# Patient Record
Sex: Male | Born: 1965 | Race: Black or African American | Hispanic: No | Marital: Married | Smoking: Never smoker
Health system: Southern US, Community
[De-identification: ages and names within clinical notes are randomized; demographics above are authoritative.]

## PROBLEM LIST (undated history)

## (undated) DIAGNOSIS — I10 Essential (primary) hypertension: Secondary | ICD-10-CM

## (undated) DIAGNOSIS — G8929 Other chronic pain: Secondary | ICD-10-CM

## (undated) DIAGNOSIS — T148XXA Other injury of unspecified body region, initial encounter: Secondary | ICD-10-CM

## (undated) DIAGNOSIS — M1990 Unspecified osteoarthritis, unspecified site: Secondary | ICD-10-CM

## (undated) DIAGNOSIS — E785 Hyperlipidemia, unspecified: Secondary | ICD-10-CM

## (undated) DIAGNOSIS — G479 Sleep disorder, unspecified: Secondary | ICD-10-CM

## (undated) DIAGNOSIS — R011 Cardiac murmur, unspecified: Secondary | ICD-10-CM

## (undated) DIAGNOSIS — R51 Headache: Secondary | ICD-10-CM

## (undated) DIAGNOSIS — R350 Frequency of micturition: Secondary | ICD-10-CM

## (undated) HISTORY — PX: QUADRICEPS REPAIR: SHX2281

## (undated) HISTORY — DX: Other chronic pain: G89.29

## (undated) HISTORY — DX: Hyperlipidemia, unspecified: E78.5

## (undated) HISTORY — PX: KNEE ARTHROSCOPY: SUR90

## (undated) HISTORY — PX: HERNIA REPAIR: SHX51

---

## 2003-02-04 ENCOUNTER — Encounter: Payer: Self-pay | Admitting: Emergency Medicine

## 2003-02-04 ENCOUNTER — Emergency Department (HOSPITAL_COMMUNITY): Admission: EM | Admit: 2003-02-04 | Discharge: 2003-02-04 | Payer: Self-pay | Admitting: Emergency Medicine

## 2003-02-07 ENCOUNTER — Emergency Department (HOSPITAL_COMMUNITY): Admission: EM | Admit: 2003-02-07 | Discharge: 2003-02-07 | Payer: Self-pay | Admitting: Emergency Medicine

## 2003-10-30 ENCOUNTER — Emergency Department (HOSPITAL_COMMUNITY): Admission: EM | Admit: 2003-10-30 | Discharge: 2003-10-30 | Payer: Self-pay | Admitting: Emergency Medicine

## 2003-11-04 ENCOUNTER — Ambulatory Visit (HOSPITAL_COMMUNITY): Admission: RE | Admit: 2003-11-04 | Discharge: 2003-11-04 | Payer: Self-pay | Admitting: Orthopaedic Surgery

## 2006-07-06 ENCOUNTER — Emergency Department (HOSPITAL_COMMUNITY): Admission: EM | Admit: 2006-07-06 | Discharge: 2006-07-06 | Payer: Self-pay | Admitting: Family Medicine

## 2006-07-07 ENCOUNTER — Emergency Department (HOSPITAL_COMMUNITY): Admission: EM | Admit: 2006-07-07 | Discharge: 2006-07-07 | Payer: Self-pay | Admitting: Emergency Medicine

## 2006-08-09 ENCOUNTER — Encounter: Admission: RE | Admit: 2006-08-09 | Discharge: 2006-08-09 | Payer: Self-pay | Admitting: Family Medicine

## 2007-06-30 ENCOUNTER — Emergency Department (HOSPITAL_COMMUNITY): Admission: EM | Admit: 2007-06-30 | Discharge: 2007-07-01 | Payer: Self-pay | Admitting: Emergency Medicine

## 2009-05-13 IMAGING — CT CT PELVIS W/ CM
1 of 3 series · 14 of 32 positions shown, 19 images · IV contrast (omnipaque)
Comparison: None.

CLINICAL DATA: Right lower quadrant pain. Evaluate for appendicitis. 
 ABDOMEN CT WITH CONTRAST:
TECHNIQUE: Multidetector CT imaging of the abdomen was performed following the standard protocol during bolus administration of intravenous contrast.
 Contrast:  100 cc Omnipaque 300
TECHNIQUE: Multidetector CT imaging of the pelvis was performed following the standard protocol during bolus administration of intravenous contrast.

[Series 2: abd_pel 5.0 b40f st · axial · 0.73mm/px · z∈[-474,-24]mm · 14 of 101 slices shown, 19 images]
[im 6/101  soft-tissue]
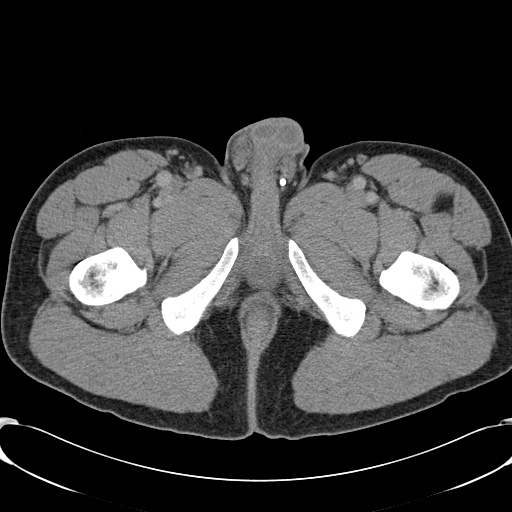
[im 6/101  bone]
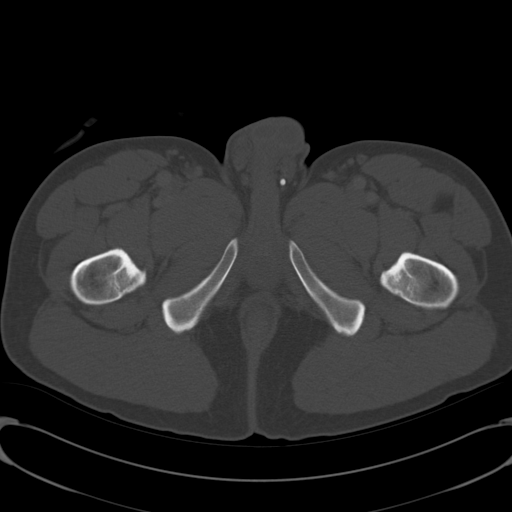
[im 16/101  soft-tissue]
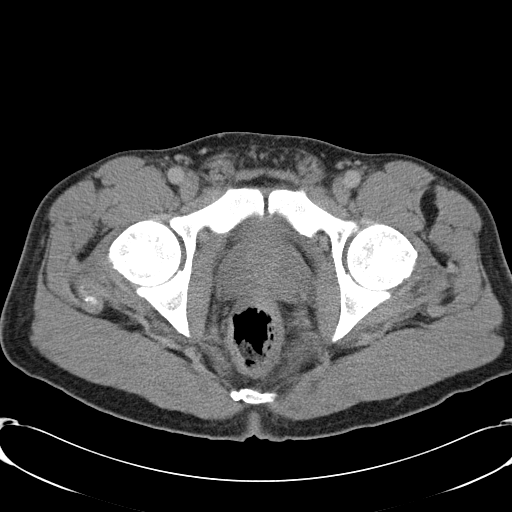
[im 21/101  soft-tissue]
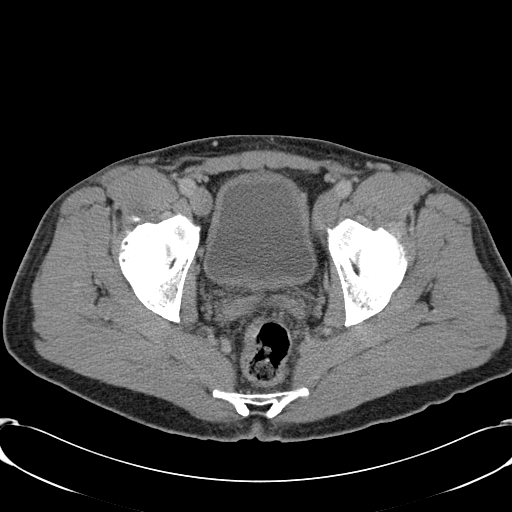
[im 31/101  soft-tissue]
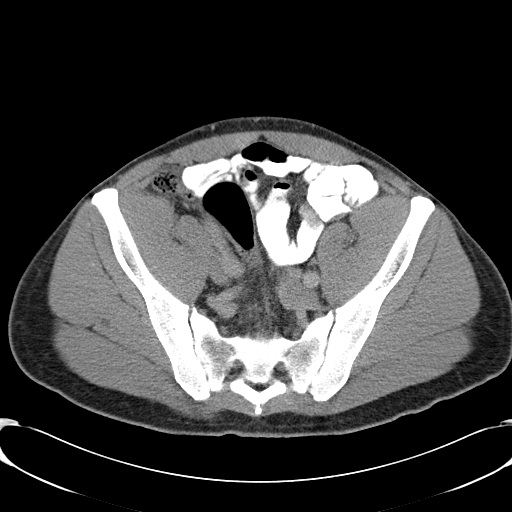
[im 36/101  soft-tissue]
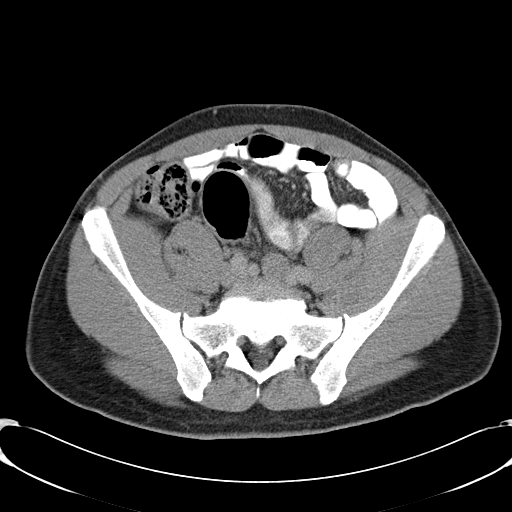
[im 46/101  soft-tissue]
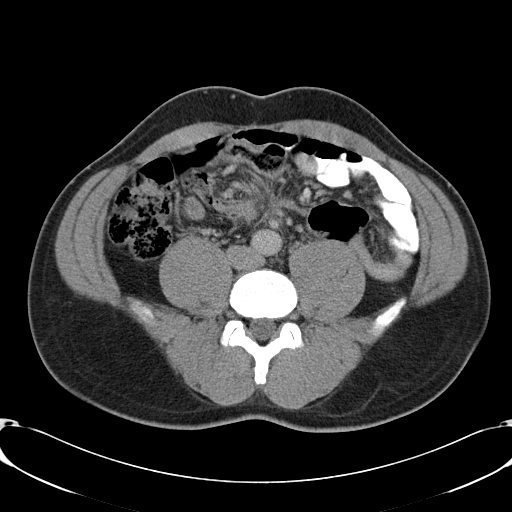
[im 51/101  soft-tissue]
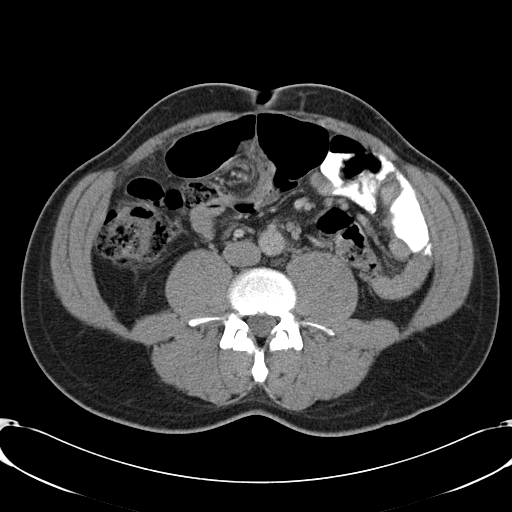
[im 56/101  soft-tissue]
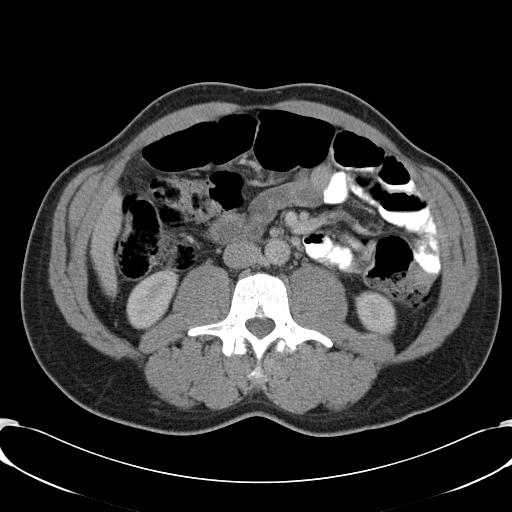
[im 66/101  soft-tissue]
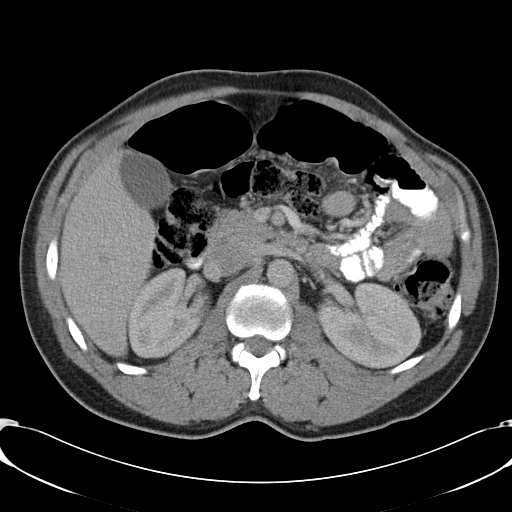
[im 66/101  bone]
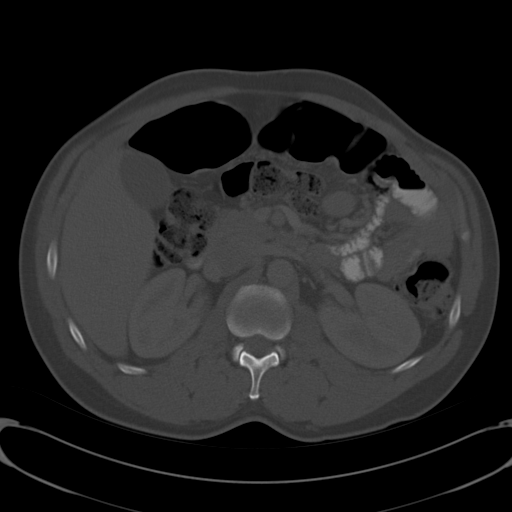
[im 71/101  soft-tissue]
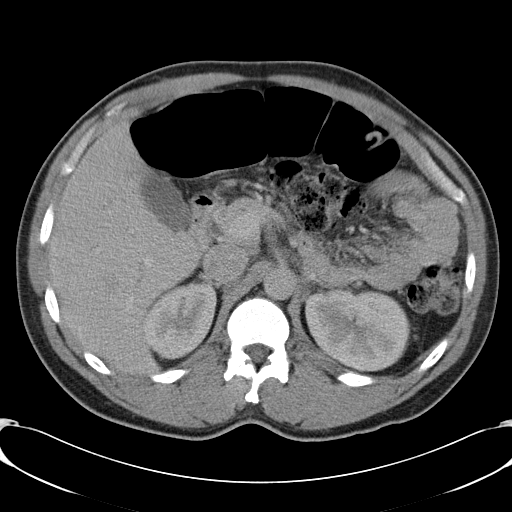
[im 81/101  soft-tissue]
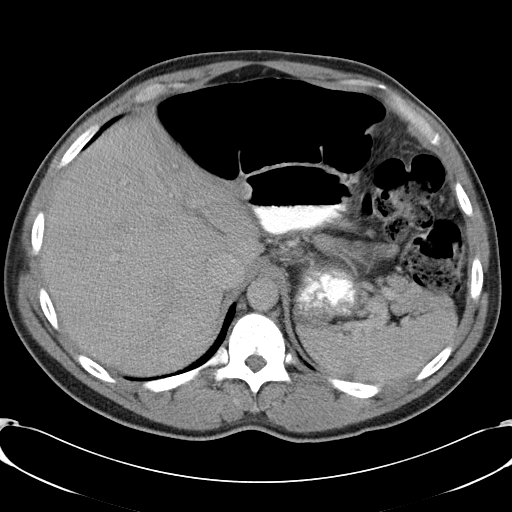
[im 81/101  lung]
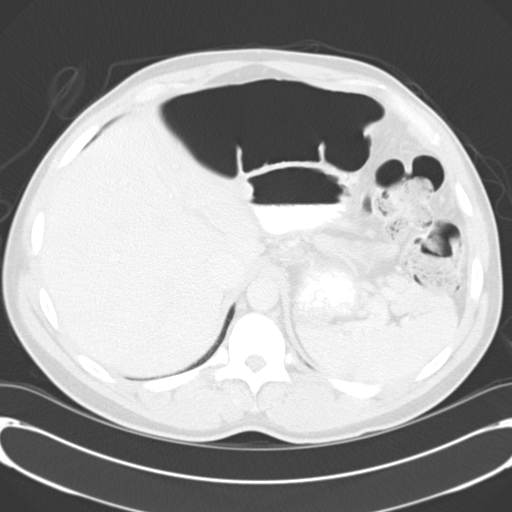
[im 86/101  soft-tissue]
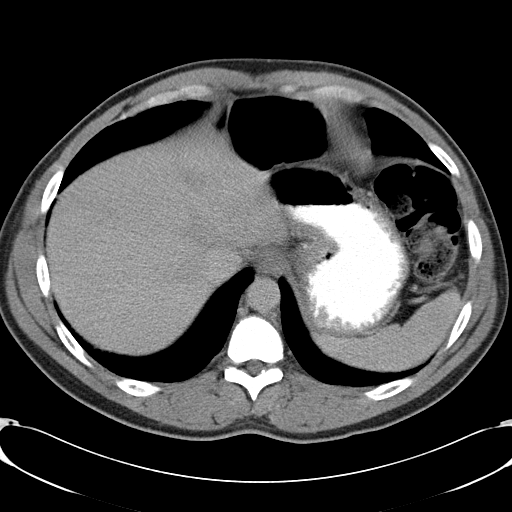
[im 86/101  lung]
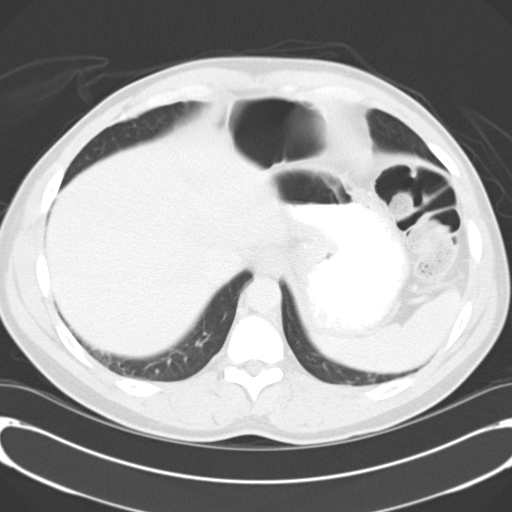
[im 91/101  lung]
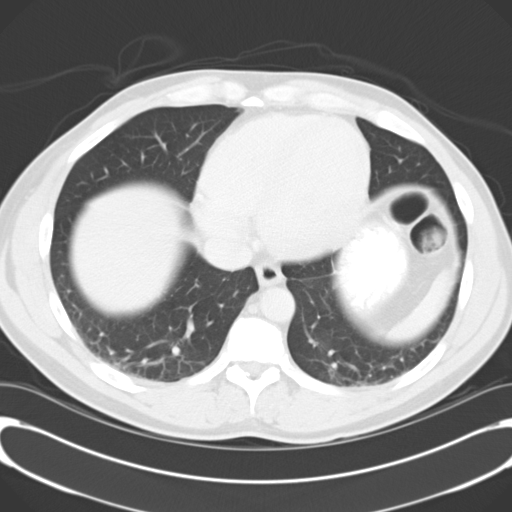
[im 96/101  soft-tissue]
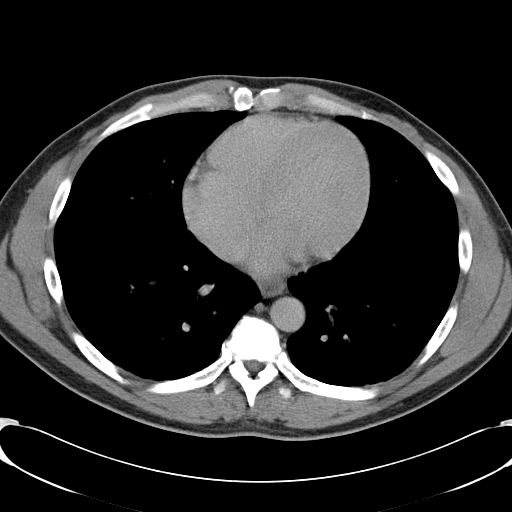
[im 96/101  lung]
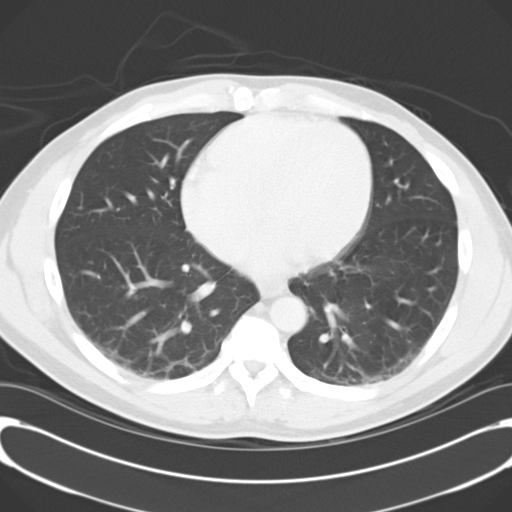

[14 of 32 positions shown; findings below may reference images not displayed]

FINDINGS: No focal abnormality is seen in the liver or spleen. The stomach, duodenum, pancreas, gallbladder, adrenal glands, and right kidney are unremarkable. 2.3 cm well-defined low density lesion in the left kidney is most compatible with a renal cyst. 
 No intraperitoneal free fluid.  No abdominal lymphadenopathy. 
 The sigmoid colon extends into the high abdomen and is seen just below the anterior diaphragm.
IMPRESSION: Distended sigmoid colon extends into the high anterior abdomen.
 PELVIS CT WITH CONTRAST:
FINDINGS: The sigmoid colon appears to twist upon itself and is distended up to 6 cm in diameter, again extending high into the anterior abdomen. There is no substantial edema within the sigmoid mesentery nor evidence for wall thickening in the sigmoid colon, but sigmoid volvulus is a distinct concern. The terminal ileum and the appendix are normal. The oral contrast has not yet reached the proximal colon. 
 I think there may be a trace amount of intraperitoneal free fluid in the anterior right pelvis. 
 Bone windows are unremarkable.
IMPRESSION: Features most consistent with sigmoid volvulus. There is a trace amount of intraperitoneal free fluid but no substantial mesenteric edema or sigmoid colon wall thickening. 
 These results were discussed with Dr. Rashaud in the [HOSPITAL] the time of study interpretation.

## 2011-03-16 NOTE — Consult Note (Signed)
[name], [name]             ACCOUNT NO.:  [id number]   MEDICAL RECORD NO.:  [id number]          PATIENT TYPE:  EMS   LOCATION:  ED                           FACILITY:  [facility]   PHYSICIAN:  Petra Kuba, M.D.    DATE OF BIRTH:  [birth year]   DATE OF CONSULTATION:  07/01/2007  DATE OF DISCHARGE:                                 CONSULTATION   HISTORY:  The patient seen at the request of the ER physician for  sigmoid volvulus.  He initially was sent by primary care to the  emergency room to rule out appendicitis.  His white count was normal.  He had a low grade temperature at 99.5.  A CT scan was reviewed with Dr.  Molli Posey, pertinent for the sigmoid volvulus and I was consulted for  further workup and plans.  He has had no previous GI issues and no  previous GI surgeries.   PAST MEDICAL HISTORY:  Pertinent for some high blood pressure and a knee  scope.   FAMILY HISTORY:  Negative for any GI problems.   CURRENT MEDICATIONS:  Include K-Dur, Lopressor, lisinopril, Norvasc and  hydrochlorothiazide.   ALLERGIES:  None.   FAMILY HISTORY:  Negative as above.   SOCIAL HISTORY:  Does not smoke.  Occasionally drinks.   REVIEW OF SYSTEMS:  Negative except above.   PHYSICAL EXAM:  VITAL SIGNS:  Stable, slight increased blood pressure  166/105, temperature 99.5.  GENERAL APPEARANCE:  No acute distress.  LUNGS:  Clear.  HEART:  Regular rate and rhythm.  ABDOMEN:  Soft, very minimal discomfort, not really too tender or  distended.   CBC normal.   CT is more impressive than his exam, pertinent for the sigmoid volvulus.   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   PLAN:  The risks, benefits and methods of flexible sigmoidoscopy were  discussed and will proceed a.s.a.p. with further workup and plans  pending those findings.  If things go well and he is feeling better,  could possibly even go home after this procedure.           ______________________________  Petra Kuba,  M.D.     MEM/MEDQ  D:  07/01/2007  T:  07/02/2007  Job:  [id number]   cc:   Gabriel Earing, M.D.  Fax: [phone]

## 2011-03-16 NOTE — Op Note (Signed)
[name], [name]             ACCOUNT NO.:  [id number]   MEDICAL RECORD NO.:  [id number]          PATIENT TYPE:  EMS   LOCATION:  ED                           FACILITY:  [facility]   PHYSICIAN:  Petra Kuba, M.D.    DATE OF BIRTH:  [birth year]   DATE OF PROCEDURE:  07/01/2007  DATE OF DISCHARGE:                               OPERATIVE REPORT   PROCEDURE:  Flexible sigmoidoscopy.   INDICATION:  Volvulus.  Consent was signed after risks, benefits,  methods, options thoroughly discussed prior to any sedation given.   MEDICINES USED:  Fentanyl 60 mcg, Versed 6 mg.   PROCEDURE:  Rectal inspection was normal.  Digital exam was negative.  Video colonoscope was inserted and, despite an unprepped colon, fairly  easily advanced to about 100 cm.  In the mid sigmoid was a partial  twist, not a complete twist so we advanced around into a dilated  segment, air was suctioned and we did advance a little further to 100  cm.  At that point, the prep did not enable [place] to advance further.  Scope was slowly withdrawn.  We suctioned as we went.  No abnormalities  were seen, but the dilated area and the partial twist which again more  suctioning was done.  There was also some washing of stool.  Once back  in the rectum, air and water were suctioned, scope removed.  The patient  tolerated the procedure well.  There was no obvious immediate  complication.   ENDOSCOPIC DIAGNOSES:  1. Unprepped exam to 100 cm.  2. Partial, but not complete, twist and volvulus.  3. Advanced past the dilated segment status post washing and      suctioning with much softer belly when we were completed.   PLAN:  Observe for delayed complications or recurrence, if not, can go  home.  Happy to see back p.r.n. particularly if this recurs.  Might need  a barium enema or colonoscopy and surgical options if this becomes a  recurrent problem.           ______________________________  Petra Kuba, M.D.     MEM/MEDQ  D:   07/01/2007  T:  07/02/2007  Job:  [id number]   cc:   Gabriel Earing, M.D.  Fax: [phone]

## 2011-08-13 LAB — COMPREHENSIVE METABOLIC PANEL
ALT: 21
AST: 28
Albumin: 4.6
Alkaline Phosphatase: 42
BUN: 11
CO2: 26
Calcium: 9.8
Chloride: 107
Creatinine, Ser: 1.06
GFR calc Af Amer: >60
GFR calc non Af Amer: >60
Glucose, Bld: 96
Potassium: 3.2 — ABNORMAL LOW
Sodium: 142
Total Bilirubin: 0.9
Total Protein: 7.4

## 2011-08-13 LAB — DIFFERENTIAL
Basophils Absolute: 0
Basophils Relative: 0
Eosinophils Absolute: 0
Eosinophils Relative: 1
Lymphocytes Relative: 20
Lymphs Abs: 1.7
Monocytes Absolute: 0.3
Monocytes Relative: 4
Neutro Abs: 6.5
Neutrophils Relative %: 76

## 2011-08-13 LAB — URINALYSIS, ROUTINE W REFLEX MICROSCOPIC
Bilirubin Urine: NEGATIVE
Glucose, UA: NEGATIVE
Hgb urine dipstick: NEGATIVE
Ketones, ur: NEGATIVE
Nitrite: NEGATIVE
Protein, ur: NEGATIVE
Specific Gravity, Urine: 1.023
Urobilinogen, UA: 1
pH: 6

## 2011-08-13 LAB — CBC
HCT: 37.8 — ABNORMAL LOW
Hemoglobin: 13
MCHC: 34.4
MCV: 86.5
Platelets: 257
RBC: 4.37
RDW: 13.1
WBC: 8.6

## 2011-08-13 LAB — URINE MICROSCOPIC-ADD ON

## 2011-08-13 LAB — LIPASE, BLOOD: Lipase: 19

## 2012-10-11 ENCOUNTER — Ambulatory Visit: Payer: Self-pay | Admitting: Orthopedic Surgery

## 2012-10-12 ENCOUNTER — Ambulatory Visit: Payer: Self-pay | Admitting: Orthopedic Surgery

## 2013-03-20 ENCOUNTER — Ambulatory Visit: Payer: Self-pay | Admitting: Orthopedic Surgery

## 2014-07-22 ENCOUNTER — Encounter (HOSPITAL_COMMUNITY): Payer: Self-pay | Admitting: Pharmacy Technician

## 2014-07-29 ENCOUNTER — Other Ambulatory Visit (HOSPITAL_COMMUNITY): Payer: Self-pay | Admitting: *Deleted

## 2014-07-29 NOTE — Patient Instructions (Addendum)
[name]  07/29/2014                           YOUR PROCEDURE IS SCHEDULED ON:  08/05/14               ENTER THRU [facility] MAIN HOSPITAL ENTRANCE AND                            FOLLOW  SIGNS TO SHORT STAY CENTER                 ARRIVE AT SHORT STAY AT:  7:00 am               CALL THIS NUMBER IF ANY PROBLEMS THE DAY OF SURGERY :               [phone]                                REMEMBER:   Do not eat food or drink liquids AFTER MIDNIGHT                  Take these medicines the morning of surgery with               A SIPS OF WATER :   OXYCODONE      Do not wear jewelry, make-up   Do not wear lotions, powders, or perfumes.   Do not shave legs or underarms 12 hrs. before surgery (men may shave face)  Do not bring valuables to the hospital.  Contacts, dentures or bridgework may not be worn into surgery.  Leave suitcase in the car. After surgery it may be brought to your room.  For patients admitted to the hospital more than one night, checkout time is            11:00 AM                                                       ________________________________________________________________________                                                                                                  [facility] - PREPARING FOR SURGERY  Before surgery, you can play an important role.  Because skin is not sterile, your skin needs to be as free of germs as possible.  You can reduce the number of germs on your skin by washing with CHG (chlorahexidine gluconate) soap before surgery.  CHG is an antiseptic cleaner which kills germs and bonds with the skin to continue killing germs even after washing. Please DO NOT use if you have an allergy to CHG or antibacterial soaps.  If your skin becomes reddened/irritated stop using the CHG and inform your nurse when you arrive  at Short Stay. Do not shave (including legs and underarms) for at least 48 hours prior to the first  CHG shower.  You may shave your face. Please follow these instructions carefully:   1.  Shower with CHG Soap the night before surgery and the  morning of Surgery.   2.  If you choose to wash your hair, wash your hair first as usual with your  normal  Shampoo.   3.  After you shampoo, rinse your hair and body thoroughly to remove the  shampoo.                                         4.  Use CHG as you would any other liquid soap.  You can apply chg directly  to the skin and wash . Gently wash with scrungie or clean wascloth    5.  Apply the CHG Soap to your body ONLY FROM THE NECK DOWN.   Do not use on open                           Wound or open sores. Avoid contact with eyes, ears mouth and genitals (private parts).                        Genitals (private parts) with your normal soap.              6.  Wash thoroughly, paying special attention to the area where your surgery  will be performed.   7.  Thoroughly rinse your body with warm water from the neck down.   8.  DO NOT shower/wash with your normal soap after using and rinsing off  the CHG Soap .                9.  Pat yourself dry with a clean towel.             10.  Wear clean pajamas.             11.  Place clean sheets on your bed the night of your first shower and do not  sleep with pets.  Day of Surgery : Do not apply any lotions/deodorants the morning of surgery.  Please wear clean clothes to the hospital/surgery center.  FAILURE TO FOLLOW THESE INSTRUCTIONS MAY RESULT IN THE CANCELLATION OF YOUR SURGERY    PATIENT SIGNATURE_________________________________  ______________________________________________________________________   [name]  An incentive spirometer is a tool that can help keep your lungs clear and active. This tool measures how well you are filling your lungs with each breath. Taking long deep breaths may help reverse or decrease the chance of developing breathing (pulmonary) problems  (especially infection) following:  A long period of time when you are unable to move or be active. BEFORE THE PROCEDURE   If the spirometer includes an indicator to show your best effort, your nurse or respiratory therapist will set it to a desired goal.  If possible, sit up straight or lean slightly forward. Try not to slouch.  Hold the incentive spirometer in an upright position. INSTRUCTIONS FOR USE  1. Sit on the edge of your bed if possible, or sit up as far as you can in bed or on a chair. 2. Hold the incentive spirometer in an upright position. 3. Breathe out  normally. 4. Place the mouthpiece in your mouth and seal your lips tightly around it. 5. Breathe in slowly and as deeply as possible, raising the piston or the ball toward the top of the column. 6. Hold your breath for 3-5 seconds or for as long as possible. Allow the piston or ball to fall to the bottom of the column. 7. Remove the mouthpiece from your mouth and breathe out normally. 8. Rest for a few seconds and repeat Steps 1 through 7 at least 10 times every 1-2 hours when you are awake. Take your time and take a few normal breaths between deep breaths. 9. The spirometer may include an indicator to show your best effort. Use the indicator as a goal to work toward during each repetition. 10. After each set of 10 deep breaths, practice coughing to be sure your lungs are clear. If you have an incision (the cut made at the time of surgery), support your incision when coughing by placing a pillow or rolled up towels firmly against it. Once you are able to get out of bed, walk around indoors and cough well. You may stop using the incentive spirometer when instructed by your caregiver.  RISKS AND COMPLICATIONS  Take your time so you do not get dizzy or light-headed.  If you are in pain, you may need to take or ask for pain medication before doing incentive spirometry. It is harder to take a deep breath if you are having  pain. AFTER USE  Rest and breathe slowly and easily.  It can be helpful to keep track of a log of your progress. Your caregiver can provide you with a simple table to help with this. If you are using the spirometer at home, follow these instructions: SEEK MEDICAL CARE IF:   You are having difficultly using the spirometer.  You have trouble using the spirometer as often as instructed.  Your pain medication is not giving enough relief while using the spirometer.  You develop fever of 100.5 F (38.1 C) or higher. SEEK IMMEDIATE MEDICAL CARE IF:   You cough up bloody sputum that had not been present before.  You develop fever of 102 F (38.9 C) or greater.  You develop worsening pain at or near the incision site. MAKE SURE YOU:   Understand these instructions.  Will watch your condition.  Will get help right away if you are not doing well or get worse. Document Released: 02/28/2007 Document Revised: 01/10/2012 Document Reviewed: 05/01/2007 [facility] Patient Information 2014 [place], [place].   ________________________________________________________________________

## 2014-07-30 ENCOUNTER — Encounter (HOSPITAL_COMMUNITY): Payer: Self-pay

## 2014-07-30 ENCOUNTER — Encounter (HOSPITAL_COMMUNITY)
Admission: RE | Admit: 2014-07-30 | Discharge: 2014-07-30 | Disposition: A | Discharge status: Home / Self Care | Payer: Worker's Compensation | Source: Ambulatory Visit | Attending: Orthopedic Surgery | Admitting: Orthopedic Surgery

## 2014-07-30 ENCOUNTER — Ambulatory Visit (HOSPITAL_COMMUNITY)
Admission: RE | Admit: 2014-07-30 | Discharge: 2014-07-30 | Disposition: A | Discharge status: Home / Self Care | Payer: Worker's Compensation | Source: Ambulatory Visit | Attending: Anesthesiology | Admitting: Anesthesiology

## 2014-07-30 DIAGNOSIS — M2449 Recurrent dislocation, other specified joint: Secondary | ICD-10-CM | POA: Insufficient documentation

## 2014-07-30 DIAGNOSIS — I10 Essential (primary) hypertension: Secondary | ICD-10-CM | POA: Insufficient documentation

## 2014-07-30 DIAGNOSIS — Z01812 Encounter for preprocedural laboratory examination: Secondary | ICD-10-CM | POA: Insufficient documentation

## 2014-07-30 DIAGNOSIS — Z01810 Encounter for preprocedural cardiovascular examination: Secondary | ICD-10-CM | POA: Insufficient documentation

## 2014-07-30 DIAGNOSIS — M2440 Recurrent dislocation, unspecified joint: Principal | ICD-10-CM

## 2014-07-30 DIAGNOSIS — Z9889 Other specified postprocedural states: Secondary | ICD-10-CM | POA: Insufficient documentation

## 2014-07-30 HISTORY — DX: Frequency of micturition: R35.0

## 2014-07-30 HISTORY — DX: Headache: R51

## 2014-07-30 HISTORY — DX: Essential (primary) hypertension: I10

## 2014-07-30 HISTORY — DX: Sleep disorder, unspecified: G47.9

## 2014-07-30 HISTORY — DX: Other injury of unspecified body region, initial encounter: T14.8XXA

## 2014-07-30 HISTORY — DX: Unspecified osteoarthritis, unspecified site: M19.90

## 2014-07-30 LAB — PROTIME-INR
INR: 0.98 (ref 0.00–1.49)
PROTHROMBIN TIME: 13 s (ref 11.6–15.2)

## 2014-07-30 LAB — URINALYSIS, ROUTINE W REFLEX MICROSCOPIC
BILIRUBIN URINE: NEGATIVE
Glucose, UA: NEGATIVE mg/dL
Hgb urine dipstick: NEGATIVE
Ketones, ur: NEGATIVE mg/dL
Leukocytes, UA: NEGATIVE
NITRITE: NEGATIVE
Protein, ur: NEGATIVE mg/dL
SPECIFIC GRAVITY, URINE: 1.016 (ref 1.005–1.030)
UROBILINOGEN UA: 1 mg/dL (ref 0.0–1.0)
pH: 6.5 (ref 5.0–8.0)

## 2014-07-30 LAB — CBC
HCT: 38.6 % — ABNORMAL LOW (ref 39.0–52.0)
HEMOGLOBIN: 12.8 g/dL — AB (ref 13.0–17.0)
MCH: 29.1 pg (ref 26.0–34.0)
MCHC: 33.2 g/dL (ref 30.0–36.0)
MCV: 87.7 fL (ref 78.0–100.0)
Platelets: 258 10*3/uL (ref 150–400)
RBC: 4.4 MIL/uL (ref 4.22–5.81)
RDW: 13.3 % (ref 11.5–15.5)
WBC: 6.3 10*3/uL (ref 4.0–10.5)

## 2014-07-30 LAB — BASIC METABOLIC PANEL
ANION GAP: 14 (ref 5–15)
BUN: 12 mg/dL (ref 6–23)
CHLORIDE: 99 meq/L (ref 96–112)
CO2: 27 mEq/L (ref 19–32)
Calcium: 9.3 mg/dL (ref 8.4–10.5)
Creatinine, Ser: 0.94 mg/dL (ref 0.50–1.35)
GFR calc Af Amer: >90 mL/min (ref >90)
GFR calc non Af Amer: >90 mL/min (ref >90)
Glucose, Bld: 102 mg/dL — ABNORMAL HIGH (ref 70–99)
POTASSIUM: 3 meq/L — AB (ref 3.7–5.3)
Sodium: 140 mEq/L (ref 137–147)

## 2014-07-30 LAB — APTT: APTT: 25 s (ref 24–37)

## 2014-07-30 NOTE — Progress Notes (Signed)
07/30/14 1603  OBSTRUCTIVE SLEEP APNEA  Have you ever been diagnosed with sleep apnea through a sleep study? No  Do you snore loudly (loud enough to be heard through closed doors)?  1  Do you often feel tired, fatigued, or sleepy during the daytime? 0  Has anyone observed you stop breathing during your sleep? 0  Do you have, or are you being treated for high blood pressure? 1  BMI more than 35 kg/m2? 0  Age over 49 years old? 0  Neck circumference greater than 40 cm/16 inches? 1  Gender: 1  Obstructive Sleep Apnea Score 4  Score 4 or greater  Results sent to PCP

## 2014-07-30 NOTE — Progress Notes (Signed)
Abnormal BMET faxed to Dr. Olin 

## 2014-07-31 LAB — NO BLOOD PRODUCTS

## 2014-08-03 NOTE — H&P (Signed)
[name] is an 48 y.o. male.    Chief Complaint:    S/P right quad repair with lengthening and persistent extensor lag  Procedure:    Revision of right quad tendon repair, open scar debridement and possible medial / lateral retinacular release  HPI: Pt is a 48 y.o. male complaining of right leg pain since September 22, 2012.   While he was stepping down from the tractor trailer to had to step away and his knee buckled.  He subsequent had a turndown quad repair.   Since that time he has had issues with instability, persistent extensor lag and pain  Pain had continually increased since the beginning.  Pt has tried various conservative treatments which have failed to alleviate their symptoms.  Various options are discussed with the patient. Risks, benefits and expectations were discussed with the patient. Patient understand the risks, benefits and expectations and wishes to proceed with surgery.   PCP: Devra DoppHOWELL, TAMIEKA, MD  D/C Plans:      Home with HHPT  Post-op Meds:       No Rx given   Tranexamic Acid:      To be given - IV    Decadron:      Is to be given  FYI:     ASA post-op  Oxycodone post-op   PMH: Past Medical History  Diagnosis Date  . Hypertension   . Headache(784.0)     hx migraines  . Arthritis   . Tendon tear     rt leg  . Frequency of urination   . Anemia   . Difficulty sleeping     due to pain    PSH: Past Surgical History  Procedure Laterality Date  . Knee arthroscopy      bilateral  . Hernia repair      Social History:  reports that he has never smoked. He does not have any smokeless tobacco history on file. He reports that he does not drink alcohol or use illicit drugs.  Allergies:  No Known Allergies  Medications: No current facility-administered medications for this encounter.   Current Outpatient Prescriptions  Medication Sig Dispense Refill  . amLODipine (NORVASC) 10 MG tablet Take 10 mg by mouth every evening.      Marland Kitchen.  aspirin-acetaminophen-caffeine (EXCEDRIN MIGRAINE) 250-250-65 MG per tablet Take 2 tablets by mouth once as needed for headache.      . cloNIDine (CATAPRES) 0.2 MG tablet Take 0.2 mg by mouth 2 (two) times daily.      . cyclobenzaprine (FLEXERIL) 10 MG tablet Take 10 mg by mouth 3 (three) times daily as needed for muscle spasms.      Marland Kitchen. lisinopril (PRINIVIL,ZESTRIL) 40 MG tablet Take 40 mg by mouth every evening.      . metoprolol succinate (TOPROL-XL) 50 MG 24 hr tablet Take 50 mg by mouth every evening. Take with or immediately following a meal.      . Oxycodone HCl 20 MG TABS Take 1 tablet by mouth 2 (two) times daily as needed (pain.).         Review of Systems  Constitutional: Negative.   Eyes: Negative.   Respiratory: Negative.   Cardiovascular: Negative.   Gastrointestinal: Negative.   Genitourinary: Positive for frequency.  Musculoskeletal: Negative.   Skin: Negative.   Neurological: Positive for headaches.  Endo/Heme/Allergies: Negative.   Psychiatric/Behavioral: The patient has insomnia.        Physical Exam  Constitutional: He is oriented to person, place, and  time. He appears well-developed and well-nourished.  HENT:  Head: Normocephalic and atraumatic.  Eyes: Pupils are equal, round, and reactive to light.  Neck: Neck supple. No JVD present. No tracheal deviation present. No thyromegaly present.  Cardiovascular: Normal rate, regular rhythm and intact distal pulses.   Murmur heard. Respiratory: Effort normal and breath sounds normal. No respiratory distress. He has no wheezes.  GI: Soft. There is no tenderness. There is no guarding.  Musculoskeletal:       Right knee: He exhibits decreased range of motion, swelling, laceration (previous healed incision), abnormal patellar mobility and bony tenderness. He exhibits no ecchymosis, no deformity and no erythema. Tenderness found.  Lymphadenopathy:    He has no cervical adenopathy.  Neurological: He is alert and oriented  to person, place, and time. A sensory deficit (some numbness in the lower right leg) is present.  Skin: Skin is warm and dry.  Psychiatric: He has a normal mood and affect.     Assessment/Plan Assessment:  S/P right quad repair with lengthening and persistent extensor lag  Plan: Patient will undergo a revision of right quad tendon repair, open scar debridement and possible medial / lateral retinacular release on 08/05/2014 per Dr. Charlann Boxer at [facility]. Risks benefits and expectations were discussed with the patient. Patient understand risks, benefits and expectations and wishes to proceed.   Anastasio Auerbach Tanush Drees   PA-C  08/03/2014, 8:50 PM

## 2014-08-05 ENCOUNTER — Encounter (HOSPITAL_COMMUNITY): Payer: Worker's Compensation | Admitting: Anesthesiology

## 2014-08-05 ENCOUNTER — Encounter (HOSPITAL_COMMUNITY)
Admission: RE | Disposition: A | Discharge status: Home / Self Care | Payer: Self-pay | Source: Ambulatory Visit | Attending: Orthopedic Surgery

## 2014-08-05 ENCOUNTER — Encounter (HOSPITAL_COMMUNITY): Payer: Self-pay | Admitting: *Deleted

## 2014-08-05 ENCOUNTER — Ambulatory Visit (HOSPITAL_COMMUNITY)
Admission: RE | Admit: 2014-08-05 | Discharge: 2014-08-06 | Disposition: A | Discharge status: Home / Self Care | Payer: Worker's Compensation | Source: Ambulatory Visit | Attending: Orthopedic Surgery | Admitting: Orthopedic Surgery

## 2014-08-05 ENCOUNTER — Inpatient Hospital Stay (HOSPITAL_COMMUNITY): Payer: Worker's Compensation | Admitting: Anesthesiology

## 2014-08-05 DIAGNOSIS — Z79899 Other long term (current) drug therapy: Secondary | ICD-10-CM | POA: Insufficient documentation

## 2014-08-05 DIAGNOSIS — I10 Essential (primary) hypertension: Secondary | ICD-10-CM | POA: Insufficient documentation

## 2014-08-05 DIAGNOSIS — D649 Anemia, unspecified: Secondary | ICD-10-CM | POA: Insufficient documentation

## 2014-08-05 DIAGNOSIS — S76111D Strain of right quadriceps muscle, fascia and tendon, subsequent encounter: Principal | ICD-10-CM | POA: Insufficient documentation

## 2014-08-05 DIAGNOSIS — X58XXXD Exposure to other specified factors, subsequent encounter: Secondary | ICD-10-CM | POA: Insufficient documentation

## 2014-08-05 DIAGNOSIS — S76119A Strain of unspecified quadriceps muscle, fascia and tendon, initial encounter: Secondary | ICD-10-CM | POA: Diagnosis present

## 2014-08-05 DIAGNOSIS — Z7982 Long term (current) use of aspirin: Secondary | ICD-10-CM | POA: Insufficient documentation

## 2014-08-05 HISTORY — PX: QUADRICEPS TENDON REPAIR: SHX756

## 2014-08-05 SURGERY — REPAIR, TENDON, QUADRICEPS
Anesthesia: Spinal | Laterality: Right

## 2014-08-05 MED ORDER — METHOCARBAMOL 1000 MG/10ML IJ SOLN
500.0000 mg | Freq: Four times a day (QID) | INTRAVENOUS | Status: DC | PRN
  Administered 2014-08-05: 500 mg via INTRAVENOUS
  Filled 2014-08-05: qty 5

## 2014-08-05 MED ORDER — DEXAMETHASONE SODIUM PHOSPHATE 10 MG/ML IJ SOLN
10.0000 mg | Freq: Once | INTRAMUSCULAR | Status: AC
  Administered 2014-08-05: 10 mg via INTRAVENOUS

## 2014-08-05 MED ORDER — DOCUSATE SODIUM 100 MG PO CAPS
100.0000 mg | ORAL_CAPSULE | Freq: Two times a day (BID) | ORAL | Status: DC
  Administered 2014-08-05 – 2014-08-06 (×2): 100 mg via ORAL

## 2014-08-05 MED ORDER — MIDAZOLAM HCL 5 MG/5ML IJ SOLN
INTRAMUSCULAR | Status: DC | PRN
  Administered 2014-08-05: 2 mg via INTRAVENOUS

## 2014-08-05 MED ORDER — MIDAZOLAM HCL 2 MG/2ML IJ SOLN
INTRAMUSCULAR | Status: AC
  Filled 2014-08-05: qty 2

## 2014-08-05 MED ORDER — ONDANSETRON HCL 4 MG PO TABS: 4.0000 mg | ORAL_TABLET | Freq: Four times a day (QID) | ORAL | Status: DC | PRN

## 2014-08-05 MED ORDER — CHLORHEXIDINE GLUCONATE 4 % EX LIQD: 60.0000 mL | Freq: Once | CUTANEOUS | Status: DC

## 2014-08-05 MED ORDER — FENTANYL CITRATE 0.05 MG/ML IJ SOLN
INTRAMUSCULAR | Status: DC | PRN
Start: 2014-08-05 — End: 2014-08-05
  Administered 2014-08-05 (×4): 50 ug via INTRAVENOUS

## 2014-08-05 MED ORDER — DEXAMETHASONE SODIUM PHOSPHATE 10 MG/ML IJ SOLN
INTRAMUSCULAR | Status: AC
  Filled 2014-08-05: qty 1

## 2014-08-05 MED ORDER — PROPOFOL 10 MG/ML IV BOLUS
INTRAVENOUS | Status: AC
  Filled 2014-08-05: qty 20

## 2014-08-05 MED ORDER — DIPHENHYDRAMINE HCL 25 MG PO CAPS: 25.0000 mg | ORAL_CAPSULE | Freq: Four times a day (QID) | ORAL | Status: DC | PRN

## 2014-08-05 MED ORDER — CEFAZOLIN SODIUM-DEXTROSE 2-3 GM-% IV SOLR
INTRAVENOUS | Status: AC
  Filled 2014-08-05: qty 50

## 2014-08-05 MED ORDER — CEFAZOLIN SODIUM-DEXTROSE 2-3 GM-% IV SOLR
2.0000 g | Freq: Four times a day (QID) | INTRAVENOUS | Status: AC
  Administered 2014-08-05 (×2): 2 g via INTRAVENOUS
  Filled 2014-08-05 (×2): qty 50

## 2014-08-05 MED ORDER — DEXAMETHASONE SODIUM PHOSPHATE 10 MG/ML IJ SOLN
10.0000 mg | Freq: Once | INTRAMUSCULAR | Status: AC
  Administered 2014-08-06: 10 mg via INTRAVENOUS
  Filled 2014-08-05: qty 1

## 2014-08-05 MED ORDER — ONDANSETRON HCL 4 MG/2ML IJ SOLN
INTRAMUSCULAR | Status: DC | PRN
  Administered 2014-08-05: 4 mg via INTRAVENOUS

## 2014-08-05 MED ORDER — METOCLOPRAMIDE HCL 10 MG PO TABS: 5.0000 mg | ORAL_TABLET | Freq: Three times a day (TID) | ORAL | Status: DC | PRN

## 2014-08-05 MED ORDER — LACTATED RINGERS IV SOLN: INTRAVENOUS | Status: DC

## 2014-08-05 MED ORDER — ALUM & MAG HYDROXIDE-SIMETH 200-200-20 MG/5ML PO SUSP: 30.0000 mL | ORAL | Status: DC | PRN

## 2014-08-05 MED ORDER — BISACODYL 10 MG RE SUPP: 10.0000 mg | Freq: Every day | RECTAL | Status: DC | PRN

## 2014-08-05 MED ORDER — PROPOFOL INFUSION 10 MG/ML OPTIME
INTRAVENOUS | Status: DC | PRN
  Administered 2014-08-05: 100 ug/kg/min via INTRAVENOUS

## 2014-08-05 MED ORDER — HYDROMORPHONE HCL 1 MG/ML IJ SOLN
0.5000 mg | INTRAMUSCULAR | Status: DC | PRN
  Administered 2014-08-05: 1 mg via INTRAVENOUS
  Administered 2014-08-06: 2 mg via INTRAVENOUS
  Filled 2014-08-05: qty 2
  Filled 2014-08-05: qty 1

## 2014-08-05 MED ORDER — ONDANSETRON HCL 4 MG/2ML IJ SOLN
INTRAMUSCULAR | Status: AC
  Filled 2014-08-05: qty 2

## 2014-08-05 MED ORDER — BUPIVACAINE HCL (PF) 0.75 % IJ SOLN
INTRAMUSCULAR | Status: DC | PRN
  Administered 2014-08-05: 2 mL

## 2014-08-05 MED ORDER — MENTHOL 3 MG MT LOZG
1.0000 | LOZENGE | OROMUCOSAL | Status: DC | PRN
  Filled 2014-08-05: qty 9

## 2014-08-05 MED ORDER — CEFAZOLIN SODIUM-DEXTROSE 2-3 GM-% IV SOLR
2.0000 g | INTRAVENOUS | Status: AC
  Administered 2014-08-05: 2 g via INTRAVENOUS

## 2014-08-05 MED ORDER — MAGNESIUM CITRATE PO SOLN: 1.0000 | Freq: Once | ORAL | Status: AC | PRN

## 2014-08-05 MED ORDER — POLYETHYLENE GLYCOL 3350 17 G PO PACK
17.0000 g | PACK | Freq: Two times a day (BID) | ORAL | Status: DC
  Administered 2014-08-05 – 2014-08-06 (×2): 17 g via ORAL

## 2014-08-05 MED ORDER — 0.9 % SODIUM CHLORIDE (POUR BTL) OPTIME
TOPICAL | Status: DC | PRN
  Administered 2014-08-05: 1000 mL

## 2014-08-05 MED ORDER — METOPROLOL SUCCINATE ER 50 MG PO TB24
50.0000 mg | ORAL_TABLET | Freq: Every evening | ORAL | Status: DC
  Administered 2014-08-05: 50 mg via ORAL
  Filled 2014-08-05 (×2): qty 1

## 2014-08-05 MED ORDER — METHOCARBAMOL 500 MG PO TABS: 500.0000 mg | ORAL_TABLET | Freq: Four times a day (QID) | ORAL | Status: DC | PRN

## 2014-08-05 MED ORDER — FENTANYL CITRATE 0.05 MG/ML IJ SOLN
INTRAMUSCULAR | Status: AC
  Filled 2014-08-05: qty 2

## 2014-08-05 MED ORDER — OXYCODONE HCL 5 MG PO TABS
5.0000 mg | ORAL_TABLET | ORAL | Status: DC
  Administered 2014-08-05: 10 mg via ORAL
  Administered 2014-08-05 – 2014-08-06 (×3): 15 mg via ORAL
  Filled 2014-08-05 (×3): qty 3
  Filled 2014-08-05: qty 2
  Filled 2014-08-05: qty 3

## 2014-08-05 MED ORDER — PROPOFOL 10 MG/ML IV BOLUS
INTRAVENOUS | Status: DC | PRN
  Administered 2014-08-05: 20 mg via INTRAVENOUS

## 2014-08-05 MED ORDER — POTASSIUM CHLORIDE 2 MEQ/ML IV SOLN
INTRAVENOUS | Status: DC
  Administered 2014-08-05 – 2014-08-06 (×2): via INTRAVENOUS
  Filled 2014-08-05 (×8): qty 1000

## 2014-08-05 MED ORDER — METOCLOPRAMIDE HCL 5 MG/ML IJ SOLN: 5.0000 mg | Freq: Three times a day (TID) | INTRAMUSCULAR | Status: DC | PRN

## 2014-08-05 MED ORDER — ONDANSETRON HCL 4 MG/2ML IJ SOLN: 4.0000 mg | Freq: Four times a day (QID) | INTRAMUSCULAR | Status: DC | PRN

## 2014-08-05 MED ORDER — HYDROMORPHONE HCL 1 MG/ML IJ SOLN: 0.2500 mg | INTRAMUSCULAR | Status: DC | PRN

## 2014-08-05 MED ORDER — BUPIVACAINE LIPOSOME 1.3 % IJ SUSP
20.0000 mL | Freq: Once | INTRAMUSCULAR | Status: DC
  Filled 2014-08-05: qty 20

## 2014-08-05 MED ORDER — AMLODIPINE BESYLATE 10 MG PO TABS
10.0000 mg | ORAL_TABLET | Freq: Every evening | ORAL | Status: DC
  Administered 2014-08-05: 10 mg via ORAL
  Filled 2014-08-05 (×2): qty 1

## 2014-08-05 MED ORDER — FERROUS SULFATE 325 (65 FE) MG PO TABS
325.0000 mg | ORAL_TABLET | Freq: Three times a day (TID) | ORAL | Status: DC
  Administered 2014-08-05 – 2014-08-06 (×3): 325 mg via ORAL
  Filled 2014-08-05 (×5): qty 1

## 2014-08-05 MED ORDER — TRANEXAMIC ACID 100 MG/ML IV SOLN
1000.0000 mg | Freq: Once | INTRAVENOUS | Status: AC
  Administered 2014-08-05: 1000 mg via INTRAVENOUS
  Filled 2014-08-05: qty 10

## 2014-08-05 MED ORDER — ASPIRIN EC 325 MG PO TBEC
325.0000 mg | DELAYED_RELEASE_TABLET | Freq: Two times a day (BID) | ORAL | Status: DC
  Administered 2014-08-06: 325 mg via ORAL
  Filled 2014-08-05 (×3): qty 1

## 2014-08-05 MED ORDER — CELECOXIB 200 MG PO CAPS
200.0000 mg | ORAL_CAPSULE | Freq: Two times a day (BID) | ORAL | Status: DC
  Administered 2014-08-05 – 2014-08-06 (×2): 200 mg via ORAL
  Filled 2014-08-05 (×3): qty 1

## 2014-08-05 MED ORDER — PHENOL 1.4 % MT LIQD
1.0000 | OROMUCOSAL | Status: DC | PRN
  Filled 2014-08-05: qty 177

## 2014-08-05 MED ORDER — LACTATED RINGERS IV SOLN
INTRAVENOUS | Status: DC
  Administered 2014-08-05: 1000 mL via INTRAVENOUS
  Administered 2014-08-05: 12:00:00 via INTRAVENOUS

## 2014-08-05 MED ORDER — CLONIDINE HCL 0.2 MG PO TABS
0.2000 mg | ORAL_TABLET | Freq: Two times a day (BID) | ORAL | Status: DC
  Administered 2014-08-05 – 2014-08-06 (×2): 0.2 mg via ORAL
  Filled 2014-08-05 (×3): qty 1

## 2014-08-05 SURGICAL SUPPLY — 61 items
BAG ZIPLOCK 12X15 (MISCELLANEOUS) ×3 IMPLANT
BANDAGE ELASTIC 6 VELCRO ST LF (GAUZE/BANDAGES/DRESSINGS) ×3 IMPLANT
BANDAGE ESMARK 6X9 LF (GAUZE/BANDAGES/DRESSINGS) ×1 IMPLANT
BIT DRILL 2.8X128 (BIT) ×2 IMPLANT
BIT DRILL 2.8X128MM (BIT) ×1
BLADE SAW SGTL 81X20 HD (BLADE) ×3 IMPLANT
BNDG ESMARK 6X9 LF (GAUZE/BANDAGES/DRESSINGS) ×3
CUFF TOURN SGL QUICK 34 (TOURNIQUET CUFF) ×2
CUFF TOURN SGL QUICK 44 (TOURNIQUET CUFF) ×3 IMPLANT
CUFF TRNQT CYL 34X4X40X1 (TOURNIQUET CUFF) ×1 IMPLANT
DERMABOND ADVANCED (GAUZE/BANDAGES/DRESSINGS) ×2
DERMABOND ADVANCED .7 DNX12 (GAUZE/BANDAGES/DRESSINGS) ×1 IMPLANT
DRAPE EXTREMITY TIBURON (DRAPES) ×3 IMPLANT
DRAPE U-SHAPE 47X51 STRL (DRAPES) ×3 IMPLANT
DRSG AQUACEL AG ADV 3.5X10 (GAUZE/BANDAGES/DRESSINGS) ×3 IMPLANT
DRSG AQUACEL AG ADV 3.5X14 (GAUZE/BANDAGES/DRESSINGS) ×3 IMPLANT
DRSG TEGADERM 4X4.75 (GAUZE/BANDAGES/DRESSINGS) ×3 IMPLANT
DURAPREP 26ML APPLICATOR (WOUND CARE) ×3 IMPLANT
ELECT REM PT RETURN 9FT ADLT (ELECTROSURGICAL) ×3
ELECTRODE REM PT RTRN 9FT ADLT (ELECTROSURGICAL) ×1 IMPLANT
FACESHIELD WRAPAROUND (MASK) IMPLANT
GAUZE SPONGE 2X2 8PLY STRL LF (GAUZE/BANDAGES/DRESSINGS) ×1 IMPLANT
GLOVE BIOGEL PI IND STRL 7.5 (GLOVE) ×1 IMPLANT
GLOVE BIOGEL PI IND STRL 8.5 (GLOVE) ×1 IMPLANT
GLOVE BIOGEL PI INDICATOR 7.5 (GLOVE) ×2
GLOVE BIOGEL PI INDICATOR 8.5 (GLOVE) ×2
GLOVE ECLIPSE 8.0 STRL XLNG CF (GLOVE) IMPLANT
GLOVE ORTHO TXT STRL SZ7.5 (GLOVE) ×6 IMPLANT
GLOVE SURG ORTHO 8.0 STRL STRW (GLOVE) ×3 IMPLANT
GOWN SPEC L3 XXLG W/TWL (GOWN DISPOSABLE) ×6 IMPLANT
GOWN STRL REUS W/TWL LRG LVL3 (GOWN DISPOSABLE) ×3 IMPLANT
IMMOBILIZER KNEE 20 (SOFTGOODS) ×3
IMMOBILIZER KNEE 20 THIGH 36 (SOFTGOODS) ×1 IMPLANT
KIT BASIN OR (CUSTOM PROCEDURE TRAY) ×3 IMPLANT
MANIFOLD NEPTUNE II (INSTRUMENTS) ×3 IMPLANT
NDL SAFETY ECLIPSE 18X1.5 (NEEDLE) ×1 IMPLANT
NEEDLE HYPO 18GX1.5 SHARP (NEEDLE) ×2
NEEDLE MA TROC 1/2 CIR (NEEDLE) ×3 IMPLANT
NEEDLE SPNL 18GX3.5 QUINCKE PK (NEEDLE) ×3 IMPLANT
NS IRRIG 1000ML POUR BTL (IV SOLUTION) ×3 IMPLANT
PACK TOTAL JOINT (CUSTOM PROCEDURE TRAY) ×3 IMPLANT
PASSER SUT SWANSON 36MM LOOP (INSTRUMENTS) ×3 IMPLANT
POSITIONER SURGICAL ARM (MISCELLANEOUS) ×3 IMPLANT
SPONGE GAUZE 2X2 STER 10/PKG (GAUZE/BANDAGES/DRESSINGS) ×2
SPONGE LAP 18X18 X RAY DECT (DISPOSABLE) ×3 IMPLANT
STAPLER VISISTAT 35W (STAPLE) ×3 IMPLANT
SUT ETHIBOND 2 OS 4 DA (SUTURE) IMPLANT
SUT ETHIBOND 5 LR DA (SUTURE) IMPLANT
SUT FIBERWIRE #2 38 T-5 BLUE (SUTURE) ×6
SUT FIBERWIRE #5 38 BLUE (WIRE) ×3 IMPLANT
SUT MNCRL AB 4-0 PS2 18 (SUTURE) ×3 IMPLANT
SUT VIC AB 0 CT1 27 (SUTURE) ×4
SUT VIC AB 0 CT1 27XBRD ANTBC (SUTURE) ×2 IMPLANT
SUT VIC AB 1 CT1 36 (SUTURE) ×15 IMPLANT
SUT VIC AB 2-0 CT1 27 (SUTURE) ×8
SUT VIC AB 2-0 CT1 TAPERPNT 27 (SUTURE) ×4 IMPLANT
SUTURE FIBERWR #2 38 T-5 BLUE (SUTURE) ×2 IMPLANT
SYR 30ML LL (SYRINGE) ×3 IMPLANT
SYR 50ML LL SCALE MARK (SYRINGE) ×3 IMPLANT
TOWEL OR 17X26 10 PK STRL BLUE (TOWEL DISPOSABLE) ×6 IMPLANT
WATER STERILE IRR 1500ML POUR (IV SOLUTION) ×3 IMPLANT

## 2014-08-05 NOTE — Interval H&P Note (Signed)
History and Physical Interval Note:  08/05/2014 9:49 AM  [name]  has presented today for surgery, with the diagnosis of STATUS POST RIGHT QUAD TENDON REPAIR WITH LENGTHENING PERSISTANT EXSTENSOR LAG  The various methods of treatment have been discussed with the patient and family. After consideration of risks, benefits and other options for treatment, the patient has consented to  Procedure(s): REVISION RIGHT  QUAD TENDON REPAIR, SCAR DEBRIDEMENT EXCISION, OPEN MEDIAL AND LATERAL RETINACULAR RELEASE (Right) as a surgical intervention .  The patient's history has been reviewed, patient examined, no change in status, stable for surgery.  I have reviewed the patient's chart and labs.  Questions were answered to the patient's satisfaction.     Shelda PalLIN,Tyger Wichman D

## 2014-08-05 NOTE — Anesthesia Preprocedure Evaluation (Addendum)
Anesthesia Evaluation  Patient identified by MRN, date of birth, ID band Patient awake    Reviewed: Allergy & Precautions, H&P , NPO status , Patient's Chart, lab work & pertinent test results, reviewed documented beta blocker date and time   Airway Mallampati: II TM Distance: >3 FB Neck ROM: full    Dental no notable dental hx. (+) Teeth Intact, Dental Advisory Given   Pulmonary neg pulmonary ROS,  breath sounds clear to auscultation  Pulmonary exam normal       Cardiovascular Exercise Tolerance: Good hypertension, Pt. on home beta blockers and Pt. on medications Rhythm:regular Rate:Normal     Neuro/Psych negative neurological ROS  negative psych ROS   GI/Hepatic negative GI ROS, Neg liver ROS,   Endo/Other  negative endocrine ROS  Renal/GU negative Renal ROS  negative genitourinary   Musculoskeletal   Abdominal   Peds  Hematology negative hematology ROS (+)   Anesthesia Other Findings   Reproductive/Obstetrics negative OB ROS                          Anesthesia Physical Anesthesia Plan  ASA: III  Anesthesia Plan: Spinal   Post-op Pain Management:    Induction:   Airway Management Planned:   Additional Equipment:   Intra-op Plan:   Post-operative Plan:   Informed Consent: I have reviewed the patients History and Physical, chart, labs and discussed the procedure including the risks, benefits and alternatives for the proposed anesthesia with the patient or authorized representative who has indicated his/her understanding and acceptance.   Dental Advisory Given  Plan Discussed with: CRNA and Surgeon  Anesthesia Plan Comments:        Anesthesia Quick Evaluation

## 2014-08-05 NOTE — Anesthesia Procedure Notes (Signed)
Spinal  Patient location during procedure: OR End time: 08/05/2014 10:45 AM Staffing Anesthesiologist: Rod Mae L CRNA/Resident: Lajuana Carry E Performed by: resident/CRNA  Preanesthetic Checklist Completed: patient identified, site marked, surgical consent, pre-op evaluation, timeout performed, IV checked, risks and benefits discussed and monitors and equipment checked Spinal Block Patient position: sitting Prep: Betadine Patient monitoring: heart rate, continuous pulse ox and blood pressure Approach: midline Location: L3-4 Injection technique: single-shot Needle Needle type: Sprotte  Needle gauge: 24 G Needle length: 10 cm Assessment Sensory level: T6 Additional Notes Kit expiration checked. Time out performed, sitting position, sterile prep and drape, lido local to L3-4, positive, clear CSF, neg heme, neg paresthesia first attempt. Tol well. Return to supine

## 2014-08-05 NOTE — Brief Op Note (Signed)
08/05/2014  12:34 PM  PATIENT:  [name]  48 y.o. male  PRE-OPERATIVE DIAGNOSIS:  STATUS POST RIGHT QUAD TENDON REPAIR WITH PERSISTANT EXSTENSOR LAG  POST-OPERATIVE DIAGNOSIS:  Status post quadricept TENDON REPAIR WITH PERSISTANT EXSTENSOR LAG  PROCEDURE:  Procedure(s): REVISION RIGHT  QUAD TENDON REPAIR, SCAR DEBRIDEMENT EXCISION, OPEN MEDIAL RETINACULAR RELEASE (Right)  SURGEON:  Surgeon(s) and Role:    * Shelda PalMatthew D Arhum Peeples, MD - Primary  PHYSICIAN ASSISTANT:  Lanney GinsMatthew Babish, PA-C  ANESTHESIA:   spinal  EBL:  Total I/O In: 1000 [I.V.:1000] Out: 650 [Urine:650]  BLOOD ADMINISTERED:none  DRAINS: none   LOCAL MEDICATIONS USED:  NONE  SPECIMEN:  No Specimen  DISPOSITION OF SPECIMEN:  N/A  COUNTS:  YES  TOURNIQUET:   Total Tourniquet Time Documented: Thigh (Right) - 58 minutes Total: Thigh (Right) - 58 minutes   DICTATION: .Other Dictation: Dictation Number [id number]  PLAN OF CARE: Admit for overnight observation  PATIENT DISPOSITION:  PACU - hemodynamically stable.   Delay start of Pharmacological VTE agent (>24hrs) due to surgical blood loss or risk of bleeding: no

## 2014-08-05 NOTE — Anesthesia Postprocedure Evaluation (Signed)
  Anesthesia Post-op Note  Patient: [name]  Procedure(s) Performed: Procedure(s) (LRB): REVISION RIGHT  QUAD TENDON REPAIR, SCAR DEBRIDEMENT EXCISION, OPEN MEDIAL AND LATERAL RETINACULAR RELEASE (Right)  Patient Location: PACU  Anesthesia Type: Spinal  Level of Consciousness: awake and alert   Airway and Oxygen Therapy: Patient Spontanous Breathing  Post-op Pain: mild  Post-op Assessment: Post-op Vital signs reviewed, Patient's Cardiovascular Status Stable, Respiratory Function Stable, Patent Airway and No signs of Nausea or vomiting  Last Vitals:  Filed Vitals:   08/05/14 1330  BP: 138/74  Pulse: 49  Temp: 36.4 C  Resp: 16    Post-op Vital Signs: stable   Complications: No apparent anesthesia complications

## 2014-08-05 NOTE — Transfer of Care (Signed)
Immediate Anesthesia Transfer of Care Note  Patient: Trey Suella BroadL XXXBrown  Procedure(s) Performed: Procedure(s) (LRB): REVISION RIGHT  QUAD TENDON REPAIR, SCAR DEBRIDEMENT EXCISION, OPEN MEDIAL AND LATERAL RETINACULAR RELEASE (Right)  Patient Location: PACU  Anesthesia Type: Spinal  Level of Consciousness: sedated, patient cooperative and responds to stimulation  Airway & Oxygen Therapy: Patient Spontanous Breathing and Patient connected to face mask oxgen  Post-op Assessment: Report given to PACU RN and Post -op Vital signs reviewed and stable  Post vital signs: Reviewed and stable  Complications: No apparent anesthesia complications

## 2014-08-06 ENCOUNTER — Encounter (HOSPITAL_COMMUNITY): Payer: Self-pay | Admitting: Orthopedic Surgery

## 2014-08-06 LAB — BASIC METABOLIC PANEL
Anion gap: 12 (ref 5–15)
BUN: 13 mg/dL (ref 6–23)
CALCIUM: 9 mg/dL (ref 8.4–10.5)
CO2: 29 mEq/L (ref 19–32)
Chloride: 98 mEq/L (ref 96–112)
Creatinine, Ser: 1.13 mg/dL (ref 0.50–1.35)
GFR, EST AFRICAN AMERICAN: 87 mL/min — AB (ref >90)
GFR, EST NON AFRICAN AMERICAN: 75 mL/min — AB (ref >90)
Glucose, Bld: 144 mg/dL — ABNORMAL HIGH (ref 70–99)
Potassium: 3.6 mEq/L — ABNORMAL LOW (ref 3.7–5.3)
SODIUM: 139 meq/L (ref 137–147)

## 2014-08-06 LAB — CBC
HCT: 35.7 % — ABNORMAL LOW (ref 39.0–52.0)
Hemoglobin: 11.8 g/dL — ABNORMAL LOW (ref 13.0–17.0)
MCH: 28.9 pg (ref 26.0–34.0)
MCHC: 33.1 g/dL (ref 30.0–36.0)
MCV: 87.5 fL (ref 78.0–100.0)
Platelets: 271 10*3/uL (ref 150–400)
RBC: 4.08 MIL/uL — ABNORMAL LOW (ref 4.22–5.81)
RDW: 13.2 % (ref 11.5–15.5)
WBC: 11.3 10*3/uL — ABNORMAL HIGH (ref 4.0–10.5)

## 2014-08-06 MED ORDER — ASPIRIN 325 MG PO TBEC: 325.0000 mg | DELAYED_RELEASE_TABLET | Freq: Two times a day (BID) | ORAL | Status: AC

## 2014-08-06 MED ORDER — FERROUS SULFATE 325 (65 FE) MG PO TABS: 325.0000 mg | ORAL_TABLET | Freq: Three times a day (TID) | ORAL | Status: DC

## 2014-08-06 MED ORDER — CYCLOBENZAPRINE HCL 10 MG PO TABS: 10.0000 mg | ORAL_TABLET | Freq: Three times a day (TID) | ORAL | Status: DC | PRN

## 2014-08-06 MED ORDER — OXYCODONE HCL 5 MG PO TABS: 5.0000 mg | ORAL_TABLET | ORAL | Status: DC | PRN

## 2014-08-06 MED ORDER — POLYETHYLENE GLYCOL 3350 17 G PO PACK: 17.0000 g | PACK | Freq: Two times a day (BID) | ORAL | Status: DC

## 2014-08-06 MED ORDER — DSS 100 MG PO CAPS: 100.0000 mg | ORAL_CAPSULE | Freq: Two times a day (BID) | ORAL | Status: DC

## 2014-08-06 NOTE — Progress Notes (Signed)
Patient ID: [name], male   DOB: 1966-10-16, 48 y.o.   MRN: [id number] Subjective: 1 Day Post-Op Procedure(s) (LRB): REVISION RIGHT  QUAD TENDON REPAIR, SCAR DEBRIDEMENT EXCISION, OPEN MEDIAL AND LATERAL RETINACULAR RELEASE (Right)    Patient reports pain as moderate.  Doing OK though.  Reviewed intra-operative findings. No significant events  Objective:   VITALS:   Filed Vitals:   08/06/14 0717  BP: 138/83  Pulse: 54  Temp: 97.7 F (36.5 C)  Resp: 18    Neurovascular intact Incision: dressing C/D/I In knee brace (T-ROM) locked in extension  LABS  Recent Labs  08/06/14 0516  HGB 11.8*  HCT 35.7*  WBC 11.3*  PLT 271     Recent Labs  08/06/14 0516  NA 139  K 3.6*  BUN 13  CREATININE 1.13  GLUCOSE 144*    No results found for this basename: LABPT, INR,  in the last 72 hours   Assessment/Plan: 1 Day Post-Op Procedure(s) (LRB): REVISION RIGHT  QUAD TENDON REPAIR, SCAR DEBRIDEMENT EXCISION, OPEN MEDIAL AND LATERAL RETINACULAR RELEASE (Right)   Advance diet Up with therapy Discharge home with home health PT  Reviewed findings and subsequent goals of therapy and activity RTC in 2 weeks Therapy to begin in 2 weeks as outpatient,

## 2014-08-06 NOTE — Progress Notes (Signed)
Discharge instructions reviewed with patient and wife utilizing teach back method no questions at this time. Patient being discharge home.

## 2014-08-06 NOTE — Op Note (Signed)
NAMPaulo Fruit:  XXXBROWN, Phillip          ACCOUNT NO.:  [id number]  MEDICAL RECORD NO.:  [id number]  LOCATION:  1615                         [facility]  PHYSICIAN:  Madlyn FrankelMatthew D. Charlann Boxerlin, M.D.  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  POSTOPERATIVE DIAGNOSIS:  Status post quad tendon rupture with primary repair with turndown with persistent extensor lag postoperatively.  PROCEDURE:  Revision quad tendon repair with medial open release of scar debridement, infrapatellar region; immobilization of the patella with primary quadriceps tendon repair to bone.  SURGEON:  Madlyn FrankelMatthew D. Charlann Boxerlin, M.D.  ASSISTANT:  Lanney GinsMatthew Babish, PA-C.  Note that Mr. [name] was present for the entirety of the case from preoperative positioning, perioperative management of the operative extremity, general facilitation of the case and primary wound closure.  ANESTHESIA:  Spinal.  SPECIMENS:  None.  COMPLICATIONS:  None.  TOURNIQUET:  58 minutes at 250 mmHg.  DRAINS:  None.  INDICATION FOR PROCEDURE:  [name] is a 48 year old male who had been seen and evaluated through Workers' [employer] evaluation with initial injury over a year and a half ago.  He had had a traumatic rupture of his quad tendon that was addressed surgically in an outside facility with reports of a turndown-type procedure performed.  I had seen him as a second opinion evaluation with persistent extensor lag.  He was worked up extensively, found to have intact neuromuscular unit.  He failed conservative measure with physical therapy.  He was unable to function to get around with this persistent extensor lag.  Given the persistence of these problems, we discussed options.  One option was to at this point to do nothing and having  the persistence with concern of lengthening of the extensor mechanism preventing appropriate muscle strengthening.  The other option was to proceed with surgical intervention in an effort to try to shorten his quad tendon, decreasing the length of the quadriceps muscle unit to improve his strength and capabilities.  Given the persistence, the extensive workup, his efforts thus far, surgery was deemed necessary in effort to try to maximize the recovery for this 48- year-old male.  Risks of persistent lag or stress, risks of infection, complication, need for future surgeries were all reviewed.  Consent was obtained for benefit of improved function.  PROCEDURE IN DETAIL:  The patient was brought to operative theater. Once adequate anesthesia, preoperative antibiotics, Ancef administered as well as tranexamic acid 1 g and Decadron, he was positioned supine with the right thigh tourniquet placed.  The right lower extremity was then prepped and draped in sterile fashion.  The time-out was performed identifying the patient, planned procedure, and extremity.  The [facility] leg holder was utilized.  The right lower extremity was then exsanguinated, tourniquet was elevated to 250 mmHg.  Used a longitudinal- based incision, extending his previous incision down distal.  Soft tissue planes were created identifying the extensor mechanism.  Once I had this area identified, I did create a peritendinous layer to reapproximate over the surgical repair.  I then  demarcated the medial lateral retinacula with the intention to open up the lateral retinaculum as well as medial, but I felt that it was in our best interest to preserve the blood supply coming from lateral.  Once I had this marked out, I used a knife and created median arthrotomy.  I then did a transverse cut right over the proximal pole of patella.  This allowed for mobilization of the patella so I was able to debride the scar  tissue intra-articularly particularly in the infrapatellar region.  Once I was satisfied with this debridement, I evaluated the tendon, I removed old suture material and then excised approximately 1 cm of the quadriceps tendon and his tendon appeared to be healthy and intact.  I then unroofed the proximal patella and used a thin ACL saw to remove a small sliver bone to create an area of punctate bleeding bone into the patella itself to allow for tendinous repair to the bone itself.  At this point, I passed two #2 FiberWires in a Krackow weave pattern through the distal quadriceps tendon with 4 strands coming out distally. I then created three drill holes in the patella, medial, central and lateral and passed the sutures one medial, two central and one lateral.  At this point, with the patella mobilized with the quadriceps tendon excised, I used a bone tenaculum and applied a proximal pole of the patella as I pulled the quadriceps tendon down to bone.  The tendon was then reapproximated to the patella and the sutures tied down distally along the inferior pole of the patella.  The sutures were tied together medially, laterally and then centrally together.  With the quadriceps mechanism tied down to the patella, I then spent time repairing the extensor mechanism medially using #1 interrupted Vicryl sutures.  I then reapproximated the quadriceps tendon tissue that I purposely left intact on the proximal aspect of the patella over top of the repaired segment of the patella using #1 Vicryl in running fashion transversely.  I then used #1 Vicryl to also reapproximate the peritendinous layer that had created over top of the sutures.  At this point, the knee was in full extension.  The patellar mobility was fairly limited.  At this point, the remainder of the wound was closed with 2-0 Vicryl and running 4-0 Monocryl.  The knee was cleaned, dried and dressed sterilely using Dermabond and  Aquacel dressing.  The tourniquet had been let down to 58 minutes.  Postoperatively, this will represent the challenge of trying to make sure we have quadriceps tendon repair without creation of a scarred gap at the repair site, but at the same time trying to improve mobilization as need to meet the most important function that we can get on this knee as his extensor function and for that reason, I will not be as concerned about his flexion that will be important to me over time his extensor mechanism and extension lag as the primary purpose of the surgery.  He will be placed back into his TROM brace and we will start physical therapy very soon to work on muscle stimulation with E-stem as well as straight leg raise exercises with assistance, probably introduce flexion within 2- 4 weeks dependent on how he is coming along.  Findings were reviewed with the patient and family.     Madlyn Frankel Charlann Boxer, M.D.     MDO/MEDQ  D:  08/05/2014  T:  08/06/2014  Job:  [id number]

## 2014-08-06 NOTE — Evaluation (Signed)
Physical Therapy Evaluation Patient Details Name: [name] MRN: [id number] DOB: 06/16/1966 Today's Date: 08/06/2014   History of Present Illness  Pt is a 48 year old male s/p revision to right quad tendon repair.  Clinical Impression  Pt currently with functional limitations due to the deficits listed below (see PT Problem List).  Pt will benefit from skilled PT to increase their independence and safety with mobility to allow discharge to the venue listed below.  Pt educated to maintain bledsoe brace and keep knee in extension (no bending) at all times until MD says otherwise.  Pt reports constant pain since previous surgery and hopes this surgery will help his pain and mobility.  Pt educated on safe stair technique sequence as pt reports no issues at home with 2 steps and crutches however states he hasn't been upstairs in his house in 2 years.  Will continue to see if remains in acute however no f/u PT recommended until knee restrictions lifted.     Follow Up Recommendations No PT follow up    Equipment Recommendations  None recommended by PT    Recommendations for Other Services       Precautions / Restrictions Precautions Required Braces or Orthoses: Other Brace/Splint Other Brace/Splint: bledsoe brace (AAT x showering when foot supported and knee straight) Restrictions Other Position/Activity Restrictions: WBAT with bledsoe brace      Mobility  Bed Mobility Overal bed mobility: Needs Assistance Bed Mobility: Supine to Sit;Sit to Supine     Supine to sit: Supervision Sit to supine: Supervision   General bed mobility comments: pt able to slowly self assist R LE using L LE for support, increased time due to pain  Transfers Overall transfer level: Needs assistance Equipment used: Rolling walker (2 wheeled) Transfers: Sit to/from Stand Sit to Stand: Min guard         General transfer comment: verbal cue for hand placement, good positioning of  LEs  Ambulation/Gait Ambulation/Gait assistance: Min guard Ambulation Distance (Feet): 120 Feet Assistive device: Rolling walker (2 wheeled) Gait Pattern/deviations: Step-to pattern;Antalgic     General Gait Details: verbal cues for sequence and WBing on RW for pain control, pt reports he usually uses crutches but also has RW at home  Stairs            Wheelchair Mobility    Modified Rankin (Stroke Patients Only)       Balance                                             Pertinent Vitals/Pain Pain Assessment: 0-10 Pain Score: 10-Worst pain ever Pain Location: R knee with movement Pain Descriptors / Indicators: Aching;Sore Pain Intervention(s): Monitored during session;Repositioned    Home Living Family/patient expects to be discharged to:: Private residence Living Arrangements: Spouse/significant other   Type of Home: House Home Access: Stairs to enter   Enter[employer] CorporationEntrance Stairs-Number of Steps: 2 Home Layout: Able to live on main level with bedroom/bathroom;Two level Home Equipment: Walker - 2 wheels;Crutches;Bedside commode      Prior Function Level of Independence: Independent with assistive device(s)               Hand Dominance        Extremity/Trunk Assessment               Lower Extremity Assessment: RLE deficits/detail RLE Deficits / Details: maintained  knee extension bledsoe brace       Communication   Communication: No difficulties  Cognition Arousal/Alertness: Awake/alert Behavior During Therapy: WFL for tasks assessed/performed Overall Cognitive Status: Within Functional Limits for tasks assessed                      General Comments      Exercises        Assessment/Plan    PT Assessment Patient needs continued PT services  PT Diagnosis Difficulty walking;Acute pain   PT Problem List Decreased activity tolerance;Decreased mobility;Decreased knowledge of use of DME;Pain;Decreased strength  PT  Treatment Interventions Gait training;DME instruction;Stair training;Functional mobility training;Therapeutic exercise;Patient/family education   PT Goals (Current goals can be found in the Care Plan section) Acute Rehab PT Goals PT Goal Formulation: With patient Time For Goal Achievement: 08/13/14 Potential to Achieve Goals: Good    Frequency Min 5X/week   Barriers to discharge        Co-evaluation               End of Session Equipment Utilized During Treatment:  (bledsoe brace) Activity Tolerance: Patient tolerated treatment well Patient left: in bed;with call bell/phone within reach      Functional Assessment Tool Used: clinical judgement Functional Limitation: Mobility: Walking and moving around Mobility: Walking and Moving Around Current Status (N5621): At least 1 percent but less than 20 percent impaired, limited or restricted Mobility: Walking and Moving Around Goal Status [phone]): 0 percent impaired, limited or restricted    Time: 1037-1057 PT Time Calculation (min): 20 min   Charges:   PT Evaluation $Initial PT Evaluation Tier I: 1 Procedure PT Treatments $Gait Training: 8-22 mins   PT G Codes:   Functional Assessment Tool Used: clinical judgement Functional Limitation: Mobility: Walking and moving around    Ada E 08/06/2014, 1:06 PM Zenovia Jarred, PT, DPT 08/06/2014 Pager: [phone]

## 2014-08-06 NOTE — Care Management Note (Signed)
    Page 1 of 1   08/06/2014     1:12:33 PM CARE MANAGEMENT NOTE 08/06/2014  Patient:  MIKAEL, SKODA   Account Number:  [id number]  Date Initiated:  08/06/2014  Documentation initiated by:  [facility]  Subjective/Objective Assessment:   adm: REVISION RIGHT  QUAD TENDON REPAIR, SCAR DEBRIDEMENT EXCISION, OPEN MEDIAL AND LATERAL RETINACULAR RELEASE (Right)     Action/Plan:   discharge planning   Anticipated DC Date:  08/06/2014   Anticipated DC Plan:  [place]  CM consult      Choice offered to / List presented to:             Status of service:   Medicare Important Message given?   (If response is "NO", the following Medicare IM given date fields will be blank) Date Medicare IM given:   Medicare IM given by:   Date Additional Medicare IM given:   Additional Medicare IM given by:    Discharge Disposition:  HOME/SELF CARE  Per UR Regulation:    If discussed at Long Length of Stay Meetings, dates discussed:    Comments:  08/06/14 11:45 Cm met with pt in room to assess for any home health/DME needs.  CM requested RN to confirm no home health/DME needs ordered from MD. MD arranging outpt schedule with pt.  No needs were communicated. Mariane Masters, BSn, CM [phone].

## 2014-08-06 NOTE — Evaluation (Signed)
Occupational Therapy Evaluation Patient Details Name: [name] MRN: [id number] DOB: 19-Dec-1965 Today's Date: 08/06/2014    History of Present Illness Pt is a 48 year old male s/p revision to right quad tendon repair.   Clinical Impression   Pt was admitted for the above.  Pt seen for education and pt/wife verbalize understanding of all education.  No further OT is needed at this time.    Follow Up Recommendations  No OT follow up    Equipment Recommendations  Tub/shower bench (sturdy)    Recommendations for Other Services       Precautions / Restrictions Precautions Required Braces or Orthoses: Other Brace/Splint Other Brace/Splint: bledsoe brace (AAT x showering when foot supported and knee straight) Restrictions Other Position/Activity Restrictions: WBAT with bledsoe brace      Mobility Bed Mobility                  Transfers                      Balance                                            ADL Overall ADL's : Needs assistance/impaired                                       General ADL Comments: pt feels he can perform adls with extra time (min guard for standing with PT--did not stand during OT) Wife can assist off commode if needed:  he used standard commode last time.  Wife can assist as needed.  Pt prefers to use standard commode.  He has a tub/shower and is familiar with tub bench--mother uses one.  Educated on wearing loose pants over bledsloe brace vs. shorts.  Educated on removing bledsloe brace for showering only when seated on tub bench, foot supported and knee straight:  educated to towel dry and reapply brace prior to getting out.  Pt/wife had no further questions.       Vision                     Perception     Praxis      Pertinent Vitals/Pain Pain Assessment: 0-10 Pain Score: 10-Worst pain ever Pain Location: R knee with movement Pain Descriptors / Indicators:  Aching;Sore Pain Intervention(s): Monitored during session;Repositioned     Hand Dominance     Extremity/Trunk Assessment            Communication Communication Communication: No difficulties   Cognition Arousal/Alertness: Awake/alert Behavior During Therapy: WFL for tasks assessed/performed Overall Cognitive Status: Within Functional Limits for tasks assessed                     General Comments       Exercises       Shoulder Instructions      Home Living Family/patient expects to be discharged to:: Private residence Living Arrangements: Spouse/significant other   Type of Home: House Home Access: Stairs to enter [employer] of Steps: 2   Home Layout: Able to live on main level with bedroom/bathroom;Two level     Bathroom Shower/Tub: Tub/shower unit Shower/tub characteristics: Engineer, building services: Standard     Home Equipment: Environmental consultant -  2 wheels;Crutches;Bedside commode          Prior Functioning/Environment Level of Independence: Independent with assistive device(s)             OT Diagnosis: Generalized weakness   OT Problem List:     OT Treatment/Interventions:      OT Goals(Current goals can be found in the care plan section)    OT Frequency:     Barriers to D/C:            Co-evaluation              End of Session    Activity Tolerance:   Patient left: in bed   Time: 1226-1238 OT Time Calculation (min): 12 min Charges:  OT General Charges $OT Visit: 1 Procedure OT Evaluation $Initial OT Evaluation Tier I: 1 Procedure G-Codes: OT G-codes **NOT FOR INPATIENT CLASS** Functional Assessment Tool Used: clinical judgment Functional Limitation: Self care Self Care Current Status (N5621([id number]): At least 1 percent but less than 20 percent impaired, limited or restricted  Terrisha Lopata 08/06/2014, 12:58 PM   Marica OtterMaryellen Wah Sabic, OTR/L [phone] 08/06/2014

## 2014-08-08 NOTE — Discharge Summary (Signed)
Physician Discharge Summary  Patient ID: [name] MRN: [id number] DOB/AGE: [birth year] 48 y.o.  Admit date: 08/05/2014 Discharge date: 08/06/2014   Procedures:  Procedure(s) (LRB): REVISION RIGHT  QUAD TENDON REPAIR, SCAR DEBRIDEMENT EXCISION, OPEN MEDIAL AND LATERAL RETINACULAR RELEASE (Right)  Attending Physician:  Dr. Durene Romans   Admission Diagnoses:   S/P right quad repair with lengthening and persistent extensor lag  Discharge Diagnoses:  Active Problems:   Quadriceps tendon rupture  Past Medical History  Diagnosis Date  . Hypertension   . Headache(784.0)     hx migraines  . Arthritis   . Tendon tear     rt leg  . Frequency of urination   . Anemia   . Difficulty sleeping     due to pain    HPI: Pt is a 48 y.o. male complaining of right leg pain since September 22, 2012. While he was stepping down from the tractor trailer to had to step away and his knee buckled. He subsequent had a turndown quad repair. Since that time he has had issues with instability, persistent extensor lag and pain Pain had continually increased since the beginning. Pt has tried various conservative treatments which have failed to alleviate their symptoms. Various options are discussed with the patient. Risks, benefits and expectations were discussed with the patient. Patient understand the risks, benefits and expectations and wishes to proceed with surgery.   PCP: Devra Dopp, MD   Discharged Condition: good  Hospital Course:  Patient underwent the above stated procedure on 08/05/2014. Patient tolerated the procedure well and brought to the recovery room in good condition and subsequently to the floor.  POD #1 BP: 138/83 ; Pulse: 54 ; Temp: 97.7 F (36.5 C) ; Resp: 18  Patient reports pain as moderate. Doing OK though. Reviewed intra-operative findings. No significant events. Ready to be discharged home. Neurovascular intact, incision: dressing C/D/I and in knee brace (T-ROM)  locked in extension.  LABS  Basename    HGB  11.8  HCT  35.7    Discharge Exam: General appearance: alert, cooperative and no distress Extremities: Homans sign is negative, no sign of DVT, no edema, redness or tenderness in the calves or thighs and no ulcers, gangrene or trophic changes  Disposition: Home with follow up in 2 weeks   Follow-up Information   Follow up with Shelda Pal, MD. Schedule an appointment as soon as possible for a visit in 2 weeks.   Specialty:  Orthopedic Surgery   Contact information:   [address] [place] [address] [phone]       Discharge Instructions   Call MD / Call 911    Complete by:  As directed   If you experience chest pain or shortness of breath, CALL 911 and be transported to the hospital emergency room.  If you develope a fever above 101 F, pus (white drainage) or increased drainage or redness at the wound, or calf pain, call your surgeon's office.     Constipation Prevention    Complete by:  As directed   Drink plenty of fluids.  Prune juice may be helpful.  You may use a stool softener, such as Colace (over the counter) 100 mg twice a day.  Use MiraLax (over the counter) for constipation as needed.     Diet - low sodium heart healthy    Complete by:  As directed      Discharge instructions    Complete by:  As directed  Maintain surgical dressing for 10-14 days, or until follow up in the clinic. [facility] Call with any questions or concerns.     Driving restrictions    Complete by:  As directed   No driving for 4 weeks     Increase activity slowly as tolerated    Complete by:  As directed      Weight bearing as tolerated    Complete by:  As directed   WBAT with Bledsoe brace in place, and locked in extension.  Laterality:  right  Extremity:  Lower             Medication List    STOP taking these medications       aspirin-acetaminophen-caffeine 250-250-65  MG per tablet  Commonly known as:  EXCEDRIN MIGRAINE      TAKE these medications       amLODipine 10 MG tablet  Commonly known as:  NORVASC  Take 10 mg by mouth every evening.     aspirin 325 MG EC tablet  Take 1 tablet (325 mg total) by mouth 2 (two) times daily.     cloNIDine 0.2 MG tablet  Commonly known as:  CATAPRES  Take 0.2 mg by mouth 2 (two) times daily.     cyclobenzaprine 10 MG tablet  Commonly known as:  FLEXERIL  Take 1 tablet (10 mg total) by mouth 3 (three) times daily as needed for muscle spasms.     DSS 100 MG Caps  Take 100 mg by mouth 2 (two) times daily.     ferrous sulfate 325 (65 FE) MG tablet  Take 1 tablet (325 mg total) by mouth 3 (three) times daily after meals.     lisinopril 40 MG tablet  Commonly known as:  PRINIVIL,ZESTRIL  Take 40 mg by mouth every evening.     metoprolol succinate 50 MG 24 hr tablet  Commonly known as:  TOPROL-XL  Take 50 mg by mouth every evening. Take with or immediately following a meal.     oxyCODONE 5 MG immediate release tablet  Commonly known as:  Oxy IR/ROXICODONE  Take 1-3 tablets (5-15 mg total) by mouth every 4 (four) hours as needed for severe pain.     polyethylene glycol packet  Commonly known as:  MIRALAX / GLYCOLAX  Take 17 g by mouth 2 (two) times daily.         Signed: Anastasio AuerbachMatthew S. Makiyla Linch   PA-C  08/08/2014, 8:29 AM

## 2015-02-18 NOTE — Op Note (Signed)
PATIENT NAME:  [name], [name] MR#:  [id number] DATE OF BIRTH:  [birth year]  DATE OF PROCEDURE:  10/12/2012  PREOPERATIVE DIAGNOSIS: Right quadriceps tendon rupture.   POSTOPERATIVE DIAGNOSIS:  Right quadriceps tendon rupture.  PROCEDURE PERFORMED: Right quadriceps tendon repair with inverted V partial flap augmentation.   SURGEON: Murlean Hark, M.D.   ASSISTANT: None.   ESTIMATED BLOOD LOSS: 50 mL.   TOURNIQUET TIME: 108 minutes at 300 mmHg.   COMPLICATIONS: No complications identified intraoperatively or postoperatively.   ANESTHESIA: General anesthesia.   PLAN: The patient will be placed in a knee immobilizer. He will be nonweightbearing  for the first week. He will be given Lovenox for two weeks for deep vein thrombosis prophylaxis   FINDINGS:  Injury was 38 days old at the time of surgery. The patient had extensive scarring of the quadriceps tendon. There was approximately 2.5 inches of retraction after debridement to healthy tissue edge.    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 He was stepping down off the back of his truck and the truck did not have a safety. He planted his right leg and felt an immediate and significant pop. He presented for evaluation. After evaluation, a quadriceps tendon rupture was suspected and he was placed in a knee immobilizer. The patient subsequently had a delay prior to having an MRI and follow-up examination. MRI revealed full thickness quadriceps tendon tear with retraction. The patient was then referred to my office for care the day prior to this surgery.   The patient is aware that if the tendon is unable to be primarily repaired due to two scarring and retraction that we may require a full thickness inverted V turndown flap from the quadriceps versus augmentation with cadaveric tissue. The patient is a TEFL teacher Witness and states that while cadaveric tissue grafting is permitted, he would prefer  to avoid this if possible. Risks and benefits of surgery were explained to the patient. The patient is aware that this surgery has a longer recovery time and a higher rate of stiffness and failure/rerupture given the chronicity and significant retraction and scarring in his situation.   DESCRIPTION OF PROCEDURE: [name] was identified in the preoperative holding area. The operative extremity was marked. Informed consent was reviewed.   The patient was brought into the operating room and placed on the table in the supine position. General anesthesia was administered.   The right lower extremity had a tourniquet applied. The extremity was prepared and draped in the usual sterile fashion.   A surgical time-out was performed identifying the patient, the procedure, the operative extremity, and confirming that the images present were, in fact, the patient's. Surgical site marking was confirmed.   The leg was elevated for exsanguination and the tourniquet was inflated to 300 mmHg. A longitudinal incision was made from the base of the patella to the palpable defect on the anterior thigh. Sharp dissection was carried down through skin and subcutaneous tissue. Sharp dissection was carried down through the retinaculum. The medial and lateral retinaculum were tagged with #1 Vicryl suture. At this time the distal aspect of the ruptured quadriceps tendon was identified. There was a significant amount of blood clot which was evacuated. The edge of the tendon was grasped with an Allis clamp. The tendon edge was debrided back to a healthy edge. The distance from the proximal pole of the patella to the tendon edge was approximately 2.3 inches. The knee was very copiously  irrigated with antibiotic saline solution. At this time attention was turned to the proximal pole of the patella. The proximal pole of the patella was debrided of soft tissue and rasped down until a bleeding edge was obtained. At this time the distal end  of the quadriceps tendon was attempted to be approximated to the patella. This was not yet possible. Therefore, extensive scar debridement was undertaken. After extensive scar debridement, the quadriceps tendon edge was approximated to the proximal pole of the patella.   At this time using a #2 FiberWire suture, a Krakow stitch was placed up the medial edge of the quadriceps tendon and a similar stitch was used back down to the midportion of the tendon. In a similar fashion a second #2 FiberWire was placed on the lateral half of the tendon. Using a 2- mm drill bit, a hole was drilled in the middle of the patella with care being taken to ensure that the joint surface was protected. A [place] suture passer was then used to carry the midportion limbs of each of the two FiberWires through this hole. A similar hole was drilled in the medial aspect of the patella as well as the lateral aspect of the patella, and the respective suture limbs were passed through these holes. At this time the knee was placed into approximately 5 degrees of hyperextension. These sutures were isolated deep to the patellar tendon on the inferior pole of the patella. The two medial limbs were tightly tied. The two lateral limbs were tightly tied. The distal aspect of the quadriceps tendon nicely reapproximated the proximal pole of the patella. The two suture limbs were then tied to each other and the sutures were cut. The knot was buried deep to the patellar tendon. At this time the knee was flexed to check the stability of the repair. The knee was able to be flexed to approximately 60 degrees with no retraction of the tendon. Due to the significant scar and degeneration of the quadriceps tendon, the decision was made to augment the repair. Therefore, the quadriceps tendon slightly more proximally was identified. A partial thickness inverted V flap was cut. This was folded down over top of our repair and sutured in place with #1 Vicryl. The  residual donor defect was approximated using a standard VY closure. At this time the knee was again copiously irrigated. The patellar retinaculum was  reapproximated over top of the repair. The fascia overlying the muscle belly was reapproximated. Subcutaneous tissue was reapproximated using a 2-0 Vicryl and skin was closed using staples. Please note that a Hemovac drain was placed prior to the fascial and retinacular closure. Tourniquet was dropped prior to closure and hemostasis was achieved.   The patient had sterile dressings applied. A Polar Care was applied. The patient was placed in a hinged knee brace locked in extension.   All counts were correct.   The patient was awakened from anesthesia and transferred to the postoperative care unit in stable condition.   ____________________________ Murlean HarkShalini Bensen [place]adderdon, MD sr:bjt D: 10/13/2012 15:14:00 ET T: 10/13/2012 15:56:18 ET JOB#: [id number]  cc: Murlean HarkShalini Bristyn Kulesza, MD, <Dictator> Murlean HarkSHALINI Nicholous Girgenti MD ELECTRONICALLY SIGNED 10/18/2012 10:26

## 2015-02-21 NOTE — Op Note (Signed)
PATIENT NAME:  [name], [name] MR#:  [id number] DATE OF BIRTH:  08-Nov-1965  DATE OF PROCEDURE:  03/20/2013  PREOPERATIVE DIAGNOSIS: Right knee stiffness, arthrofibrosis, 5 months after delayed quadriceps tendon repair.   SURGEON: Murlean Hark, M.D.   ASSISTANT: None.   PROCEDURES:  1.  Manipulation under anesthesia.  2.  Arthroscopic lysis of adhesions, extensive synovectomy, moderate chondroplasty; right knee.   POSTOPERATIVE DIAGNOSIS: Right knee stiffness, arthrofibrosis, 5 months after delayed quadriceps tendon repair.   ESTIMATED BLOOD LOSS: Minimal.   ANESTHESIA: General anesthesia   PREOPERATIVE FINDINGS: Preoperative motion, preanesthesia motion is 55 degrees of flexion, post-induction of anesthesia with relaxation 85 degrees, post-arthroscopic lysis of adhesions 126 degrees.   COMPLICATIONS: None.   DISPOSITION: The patient will be placed in a CPM immediately postoperatively. He will see physical therapy for today and tomorrow. He will be admitted for pain control and immediate physical therapy.   He has an outpatient physical therapy appointment scheduled for the day after surgery with his established physical therapist. He will receive physical therapy 5 days per week for 2 weeks. He will have a CPM at home. He has a TENS unit in place. He is a Theatre stage manager.   INDICATIONS FOR PROCEDURE: [name] is a 49 year old gentleman who sustained a quadriceps tendon rupture in November. Unfortunately he did not present for surgical evaluation until 3 weeks post-injury. Repair was undertaken using a partial turn-down flap. The patient had significant difficulty regaining full range of motion. After extensive physical therapy, the decision was made to return to the operating room for a diagnosis of arthrofibrosis of the knee.   DESCRIPTION OF PROCEDURE: [name] was identified in the preoperative holding area. The right lower extremity was marked as the operative site. Risks and  benefits were explained to the patient. The postoperative course, including expected course of rehabilitation, was reviewed with the patient.   The patient was brought into the operating room. General anesthesia was administered. A tourniquet was applied to the right lower extremity. The right lower extremity was prepared and draped in the usual sterile fashion. With full relaxation, gentle manipulation was carried out bringing the patient's knee to 85 degrees of flexion. At this time, a standard lateral viewing portal was made and an arthroscope was inserted, and it was inserted into the patellofemoral compartment. There was extensive scar tissue formation at the proximal pole of the patella, and the patella was only minimally mobile. Medial and lateral gutters were evaluated. Lateral gutters demonstrated very mild scar tissue formation. Medial gutter demonstrated significant scar tissue formation. Attention was turned to the medial compartment. The patient had some grade II chondromalacia, medial femoral condyle and in the medial tibial plateau. Meniscus was relatively well preserved, with some mild fraying. The notch demonstrated extensive synovium and a very hypertrophic fat pad. Attention was turned to the lateral compartment, which demonstrated grade I chondromalacia of the femoral condyle and tibial plateau, with excellent preservation of the meniscus.   Under direct visualization, a medial portal was formed and a shaver was inserted. Attention was returned to the patellofemoral compartment. There was grade II chondromalacia of the patella, and grade II to III chondromalacia of the trochlear groove. A shaver was used to begin debriding the extensive amounts of scar tissue in the suprapatellar region. ArthroCare wand was also used to help complete some of the lysis of adhesions. Debridement was carried out to the level where the undersurface of the quad fascia was just visible. The medial and lateral  gutters were also carefully debrided of adhesions.   At this time, a full-radius shaver was inserted and a mild chondroplasty was done of the trochlear groove and the patella. Attention was now turned to the notch in the anterior aspect of the knee. Using a combination of a shaver and ArthroCare wand, a very extensive fat pad debridement and synovectomy was carried out. At this time all instruments were removed. The knee was flexed. Flexion was achieved to 126 degrees. The patella demonstrated excellent medial-lateral mobility and proximal-distal mobility. The quadriceps tendon was readily palpable, with no defect. Patellar tendon was readily palpable, with no defect.   Portals were closed using nylon suture. TENS leads were applied. Sterile dressings were applied. Polar Care was applied.   The patient was awakened and transferred to the postoperative care unit in stable condition. Please note that prior to closure the tourniquet was dropped. The patient was placed in a CPM machine set from 0 to 120 degrees after a femoral nerve block was administered postoperatively. He will see physical therapy later today for range of motion. Prior to nerve block, the patient was able to fire his quadriceps muscles for one basic quadriceps set. He was neurovascularly intact.      ____________________________ Murlean HarkShalini Reagen Haberman, MD sr:dm D: 03/23/2013 08:20:13 ET T: 03/23/2013 10:52:28 ET JOB#: [id number]  cc: Murlean HarkShalini Basha Krygier, MD, <Dictator> Murlean HarkSHALINI Evander Macaraeg MD ELECTRONICALLY SIGNED 04/02/2013 14:17

## 2015-07-23 NOTE — Progress Notes (Signed)
Please put orders in Epic surgery 08-11-15 pre op 08-06-15 Thanks 

## 2015-08-05 NOTE — Patient Instructions (Signed)
[name]  08/05/2015   Your procedure is scheduled on: 08/11/2015    Report to [facility] Main  Entrance take [facility]  elevators to 3rd floor to  Short Stay Center at    0530 AM.  Call this number if you have problems the morning of surgery [phone]   Remember: ONLY 1 PERSON MAY GO WITH YOU TO SHORT STAY TO GET  READY MORNING OF YOUR SURGERY.  Do not eat food or drink liquids :After Midnight.     Take these medicines the morning of surgery with A SIP OF WATER: Clonidine ( Catapres)                                You may not have any metal on your body including hair pins and              piercings  Do not wear jewelry, , lotions, powders or perfumes, deodorant                          Men may shave face and neck.   Do not bring valuables to the hospital. [facility] IS NOT             RESPONSIBLE   FOR VALUABLES.  Contacts, dentures or bridgework may not be worn into surgery.  Leave suitcase in the car. After surgery it may be brought to your room.       Special Instructions: coughing and deep breathing exercises, leg exercises               Please read over the following fact sheets you were given: _____________________________________________________________________             [facility] - Preparing for Surgery Before surgery, you can play an important role.  Because skin is not sterile, your skin needs to be as free of germs as possible.  You can reduce the number of germs on your skin by washing with CHG (chlorahexidine gluconate) soap before surgery.  CHG is an antiseptic cleaner which kills germs and bonds with the skin to continue killing germs even after washing. Please DO NOT use if you have an allergy to CHG or antibacterial soaps.  If your skin becomes reddened/irritated stop using the CHG and inform your nurse when you arrive at Short Stay. Do not shave (including legs and underarms) for at least 48 hours prior to the first CHG  shower.  You may shave your face/neck. Please follow these instructions carefully:  1.  Shower with CHG Soap the night before surgery and the  morning of Surgery.  2.  If you choose to wash your hair, wash your hair first as usual with your  normal  shampoo.  3.  After you shampoo, rinse your hair and body thoroughly to remove the  shampoo.                           4.  Use CHG as you would any other liquid soap.  You can apply chg directly  to the skin and wash                       Gently with a scrungie or clean washcloth.  5.  Apply the CHG Soap to your body ONLY FROM THE NECK DOWN.   Do not use on face/ open                           Wound or open sores. Avoid contact with eyes, ears mouth and genitals (private parts).                       Wash face,  Genitals (private parts) with your normal soap.             6.  Wash thoroughly, paying special attention to the area where your surgery  will be performed.  7.  Thoroughly rinse your body with warm water from the neck down.  8.  DO NOT shower/wash with your normal soap after using and rinsing off  the CHG Soap.                9.  Pat yourself dry with a clean towel.            10.  Wear clean pajamas.            11.  Place clean sheets on your bed the night of your first shower and do not  sleep with pets. Day of Surgery : Do not apply any lotions/deodorants the morning of surgery.  Please wear clean clothes to the hospital/surgery center.  FAILURE TO FOLLOW THESE INSTRUCTIONS MAY RESULT IN THE CANCELLATION OF YOUR SURGERY PATIENT SIGNATURE_________________________________  NURSE SIGNATURE__________________________________  ________________________________________________________________________   Adam Phenix  An incentive spirometer is a tool that can help keep your lungs clear and active. This tool measures how well you are filling your lungs with each breath. Taking long deep breaths may help reverse or decrease the chance  of developing breathing (pulmonary) problems (especially infection) following:  A long period of time when you are unable to move or be active. BEFORE THE PROCEDURE   If the spirometer includes an indicator to show your best effort, your nurse or respiratory therapist will set it to a desired goal.  If possible, sit up straight or lean slightly forward. Try not to slouch.  Hold the incentive spirometer in an upright position. INSTRUCTIONS FOR USE  1. Sit on the edge of your bed if possible, or sit up as far as you can in bed or on a chair. 2. Hold the incentive spirometer in an upright position. 3. Breathe out normally. 4. Place the mouthpiece in your mouth and seal your lips tightly around it. 5. Breathe in slowly and as deeply as possible, raising the piston or the ball toward the top of the column. 6. Hold your breath for 3-5 seconds or for as long as possible. Allow the piston or ball to fall to the bottom of the column. 7. Remove the mouthpiece from your mouth and breathe out normally. 8. Rest for a few seconds and repeat Steps 1 through 7 at least 10 times every 1-2 hours when you are awake. Take your time and take a few normal breaths between deep breaths. 9. The spirometer may include an indicator to show your best effort. Use the indicator as a goal to work toward during each repetition. 10. After each set of 10 deep breaths, practice coughing to be sure your lungs are clear. If you have an incision (the cut made at the time of surgery), support your incision when coughing by placing a pillow or  rolled up towels firmly against it. Once you are able to get out of bed, walk around indoors and cough well. You may stop using the incentive spirometer when instructed by your caregiver.  RISKS AND COMPLICATIONS  Take your time so you do not get dizzy or light-headed.  If you are in pain, you may need to take or ask for pain medication before doing incentive spirometry. It is harder to  take a deep breath if you are having pain. AFTER USE  Rest and breathe slowly and easily.  It can be helpful to keep track of a log of your progress. Your caregiver can provide you with a simple table to help with this. If you are using the spirometer at home, follow these instructions: [place] IF:   You are having difficultly using the spirometer.  You have trouble using the spirometer as often as instructed.  Your pain medication is not giving enough relief while using the spirometer.  You develop fever of 100.5 F (38.1 C) or higher. SEEK IMMEDIATE MEDICAL CARE IF:   You cough up bloody sputum that had not been present before.  You develop fever of 102 F (38.9 C) or greater.  You develop worsening pain at or near the incision site. MAKE SURE YOU:   Understand these instructions.  Will watch your condition.  Will get help right away if you are not doing well or get worse. Document Released: 02/28/2007 Document Revised: 01/10/2012 Document Reviewed: 05/01/2007 [facility] Patient Information 2014 [place], [place].   ________________________________________________________________________

## 2015-08-05 NOTE — Progress Notes (Signed)
Consent for surgery on 08/11/2015 has abbreviations.  Please correct consent.  Preop on 08/06/2015 at 0800am . Thank You.

## 2015-08-06 ENCOUNTER — Encounter (HOSPITAL_COMMUNITY)
Admission: RE | Admit: 2015-08-06 | Discharge: 2015-08-06 | Disposition: A | Discharge status: Home / Self Care | Payer: Worker's Compensation | Source: Ambulatory Visit | Attending: Orthopedic Surgery | Admitting: Orthopedic Surgery

## 2015-08-06 ENCOUNTER — Encounter (HOSPITAL_COMMUNITY): Payer: Self-pay

## 2015-08-06 ENCOUNTER — Other Ambulatory Visit: Payer: Self-pay

## 2015-08-06 DIAGNOSIS — Z01818 Encounter for other preprocedural examination: Secondary | ICD-10-CM | POA: Diagnosis present

## 2015-08-06 HISTORY — DX: Cardiac murmur, unspecified: R01.1

## 2015-08-06 LAB — BASIC METABOLIC PANEL
ANION GAP: 8 (ref 5–15)
BUN: 12 mg/dL (ref 6–20)
CHLORIDE: 105 mmol/L (ref 101–111)
CO2: 30 mmol/L (ref 22–32)
Calcium: 9.5 mg/dL (ref 8.9–10.3)
Creatinine, Ser: 0.98 mg/dL (ref 0.61–1.24)
GFR calc non Af Amer: >60 mL/min (ref >60)
Glucose, Bld: 114 mg/dL — ABNORMAL HIGH (ref 65–99)
Potassium: 3.3 mmol/L — ABNORMAL LOW (ref 3.5–5.1)
SODIUM: 143 mmol/L (ref 135–145)

## 2015-08-06 LAB — CBC
HCT: 37.7 % — ABNORMAL LOW (ref 39.0–52.0)
HEMOGLOBIN: 12.2 g/dL — AB (ref 13.0–17.0)
MCH: 29 pg (ref 26.0–34.0)
MCHC: 32.4 g/dL (ref 30.0–36.0)
MCV: 89.8 fL (ref 78.0–100.0)
PLATELETS: 281 10*3/uL (ref 150–400)
RBC: 4.2 MIL/uL — AB (ref 4.22–5.81)
RDW: 13.6 % (ref 11.5–15.5)
WBC: 5.6 10*3/uL (ref 4.0–10.5)

## 2015-08-06 LAB — PROTIME-INR
INR: 1 (ref 0.00–1.49)
PROTHROMBIN TIME: 13.4 s (ref 11.6–15.2)

## 2015-08-06 LAB — URINALYSIS, ROUTINE W REFLEX MICROSCOPIC
BILIRUBIN URINE: NEGATIVE
Glucose, UA: NEGATIVE mg/dL
HGB URINE DIPSTICK: NEGATIVE
Ketones, ur: NEGATIVE mg/dL
Leukocytes, UA: NEGATIVE
NITRITE: NEGATIVE
PROTEIN: NEGATIVE mg/dL
SPECIFIC GRAVITY, URINE: 1.019 (ref 1.005–1.030)
UROBILINOGEN UA: 1 mg/dL (ref 0.0–1.0)
pH: 7 (ref 5.0–8.0)

## 2015-08-06 LAB — NO BLOOD PRODUCTS

## 2015-08-06 LAB — APTT: aPTT: 27 seconds (ref 24–37)

## 2015-08-06 NOTE — Progress Notes (Signed)
BMP done 08/06/2015 faxed via EPIC to Dr Charlann Boxer.

## 2015-08-06 NOTE — Progress Notes (Signed)
Final EKG in EPIC done 08/06/2015.

## 2015-08-06 NOTE — Progress Notes (Signed)
Advanced directive along with Blood Product Refusal consent faxed to office of Dr Charlann Boxer for notification.  Blood Product refusal form faxed to [facility]. Fax confirmations received.

## 2015-08-07 NOTE — Progress Notes (Signed)
Final EKG in EPIC done 08/06/15.

## 2015-08-08 NOTE — H&P (Signed)
Phillip L Veney Sr. is an 49 y.o. male.     Chief Complaint: S/P revision of right quad repair with lengthening and persistent extensor lag  Procedure: Re-revision of right quad tendon repair and open scar debridement   HPI: Pt is a 49 y.o. male complaining of right leg pain since September 22, 2012. While he was stepping down from the tractor trailer to had to step away and his knee buckled. He subsequent had a turndown quad repair. Since that time he has had issues with instability, persistent extensor lag and pain Pain had continually increased since the beginning. Pt has tried various conservative treatments which have failed to alleviate their symptoms. On 08/05/2014 he underwent a revision quad tendon repair with medial open release of scar per Dr. Charlann Boxer.  Upon return to the clinic recent he continues to have extensor lag in the right leg. Continues to have to use assistance devices and braces.  Options have been discussed including risks, benefits and expectations.Patient understand the risks, benefits and expectations and wishes to proceed with surgery.   WGN:FAOZHY, TAMIEKA, MD  D/C Plans: Home with HHPT  Post-op Meds: No Rx given   Tranexamic Acid: To be given - IV   Decadron: Is to be given  FYI: ASA post-op Oxycodone post-op    PMH: Past Medical History  Diagnosis Date  . Hypertension   . Headache(784.0)     hx migraines  . Arthritis   . Tendon tear     rt leg  . Frequency of urination   . Difficulty sleeping     due to pain  . Heart murmur     as a child     PSH: Past Surgical History  Procedure Laterality Date  . Knee arthroscopy      bilateral  . Hernia repair    . Quadriceps tendon repair Right 08/05/2014    Procedure: REVISION RIGHT  QUAD TENDON REPAIR, SCAR DEBRIDEMENT EXCISION, OPEN MEDIAL AND LATERAL RETINACULAR RELEASE;  Surgeon: Shelda Pal, MD;  Location: [facility] ORS;  Service: Orthopedics;   Laterality: Right;    Social History:  reports that he has never smoked. He has never used smokeless tobacco. He reports that he drinks alcohol. He reports that he does not use illicit drugs.  Allergies:  Allergies  Allergen Reactions  . Other     No Blood Products     Medications: No current facility-administered medications for this encounter.   Current Outpatient Prescriptions  Medication Sig Dispense Refill  . amLODipine (NORVASC) 10 MG tablet Take 10 mg by mouth every evening.    Marland Kitchen aspirin-acetaminophen-caffeine (EXCEDRIN MIGRAINE) 250-250-65 MG tablet Take 1 tablet by mouth every 6 (six) hours as needed for headache.    Marland Kitchen atorvastatin (LIPITOR) 20 MG tablet Take 20 mg by mouth daily.    . cloNIDine (CATAPRES) 0.2 MG tablet Take 0.2 mg by mouth 2 (two) times daily.    Marland Kitchen lisinopril (PRINIVIL,ZESTRIL) 40 MG tablet Take 40 mg by mouth every evening.    . metoprolol succinate (TOPROL-XL) 100 MG 24 hr tablet Take 100 mg by mouth every evening. Take with or immediately following a meal.    . cyclobenzaprine (FLEXERIL) 10 MG tablet Take 1 tablet (10 mg total) by mouth 3 (three) times daily as needed for muscle spasms. (Patient not taking: Reported on 08/05/2015) 30 tablet 0  . cyclobenzaprine (FLEXERIL) 10 MG tablet Take 10 mg by mouth 3 (three) times daily as needed for muscle spasms.    Marland Kitchen  docusate sodium 100 MG CAPS Take 100 mg by mouth 2 (two) times daily. (Patient not taking: Reported on 08/05/2015) 10 capsule 0  . ferrous sulfate 325 (65 FE) MG tablet Take 1 tablet (325 mg total) by mouth 3 (three) times daily after meals. (Patient not taking: Reported on 08/05/2015)  3  . oxyCODONE (OXY IR/ROXICODONE) 5 MG immediate release tablet Take 1-3 tablets (5-15 mg total) by mouth every 4 (four) hours as needed for severe pain. (Patient not taking: Reported on 08/05/2015) 120 tablet 0  . polyethylene glycol (MIRALAX / GLYCOLAX) packet Take 17 g by mouth 2 (two) times daily. (Patient not taking:  Reported on 08/05/2015) 14 each 0     Review of Systems  Constitutional: Negative.   Eyes: Negative.   Respiratory: Negative.   Cardiovascular: Negative.   Gastrointestinal: Negative.   Genitourinary: Positive for frequency.  Musculoskeletal: Positive for joint pain.  Skin: Negative.   Neurological: Positive for headaches.  Endo/Heme/Allergies: Negative.   Psychiatric/Behavioral: Negative.         Physical Exam  Constitutional: He is oriented to person, place, and time. He appears well-developed and well-nourished.  HENT:  Head: Normocephalic.  Eyes: Pupils are equal, round, and reactive to light.  Neck: Neck supple. No JVD present. No tracheal deviation present. No thyromegaly present.  Cardiovascular: Normal rate and intact distal pulses.   Respiratory: Effort normal and breath sounds normal. No respiratory distress. He has no wheezes.  GI: Soft. There is no tenderness. There is no guarding.  Musculoskeletal:       Right knee: He exhibits decreased range of motion, swelling, deformity, laceration (healed previous incision), abnormal patellar mobility and bony tenderness. He exhibits no erythema. Tenderness found.  Lymphadenopathy:    He has no cervical adenopathy.  Neurological: He is alert and oriented to person, place, and time.  Skin: Skin is warm and dry.  Psychiatric: He has a normal mood and affect.     Assessment/Plan Assessment: S/P revision of right quad repair with lengthening and persistent extensor lag   Plan: Patient will undergo a re-revision of right quad tendon repair and open scar debridement  on 08/11/2015 per Dr. Charlann Boxer at [facility]. Risks benefits and expectations were discussed with the patient. Patient understand risks, benefits and expectations and wishes to proceed.     Phillip Auerbach Jazmine Longshore   PA-C  08/08/2015, 10:27 AM

## 2015-08-10 ENCOUNTER — Encounter (HOSPITAL_COMMUNITY): Payer: Self-pay | Admitting: Anesthesiology

## 2015-08-10 NOTE — Anesthesia Preprocedure Evaluation (Addendum)
Anesthesia Evaluation  Patient identified by MRN, date of birth, ID band Patient awake    Reviewed: Allergy & Precautions, NPO status , Patient's Chart, lab work & pertinent test results  Airway Mallampati: II  TM Distance: >3 FB Neck ROM: Full    Dental no notable dental hx.    Pulmonary neg pulmonary ROS,    Pulmonary exam normal breath sounds clear to auscultation       Cardiovascular Exercise Tolerance: Good hypertension, Pt. on medications and Pt. on home beta blockers Normal cardiovascular exam+ Valvular Problems/Murmurs  Rhythm:Regular Rate:Normal     Neuro/Psych  Headaches, negative psych ROS   GI/Hepatic negative GI ROS, Neg liver ROS,   Endo/Other  negative endocrine ROS  Renal/GU negative Renal ROS  negative genitourinary   Musculoskeletal  (+) Arthritis ,   Abdominal   Peds negative pediatric ROS (+)  Hematology negative hematology ROS (+)   Anesthesia Other Findings   Reproductive/Obstetrics negative OB ROS                          Anesthesia Physical Anesthesia Plan  ASA: II  Anesthesia Plan: General   Post-op Pain Management:    Induction: Intravenous  Airway Management Planned: LMA  Additional Equipment:   Intra-op Plan:   Post-operative Plan: Extubation in OR  Informed Consent: I have reviewed the patients History and Physical, chart, labs and discussed the procedure including the risks, benefits and alternatives for the proposed anesthesia with the patient or authorized representative who has indicated his/her understanding and acceptance.   Dental advisory given  Plan Discussed with: CRNA  Anesthesia Plan Comments: (Discussed risks and benefits of and differences between spinal and general. He has had two spinals in the past and feels like they caused him problems postoperatively. He prefers general. He declines nerve block. Discussed with Dr. Charlann Boxer.)        Anesthesia Quick Evaluation

## 2015-08-11 ENCOUNTER — Encounter (HOSPITAL_COMMUNITY)
Admission: RE | Disposition: A | Discharge status: Home / Self Care | Payer: Self-pay | Source: Ambulatory Visit | Attending: Orthopedic Surgery

## 2015-08-11 ENCOUNTER — Observation Stay (HOSPITAL_COMMUNITY)
Admission: RE | Admit: 2015-08-11 | Discharge: 2015-08-12 | Disposition: A | Discharge status: Home / Self Care | Payer: Worker's Compensation | Source: Ambulatory Visit | Attending: Orthopedic Surgery | Admitting: Orthopedic Surgery

## 2015-08-11 ENCOUNTER — Encounter (HOSPITAL_COMMUNITY): Payer: Self-pay | Admitting: *Deleted

## 2015-08-11 ENCOUNTER — Ambulatory Visit (HOSPITAL_COMMUNITY): Payer: Worker's Compensation | Admitting: Anesthesiology

## 2015-08-11 DIAGNOSIS — R350 Frequency of micturition: Secondary | ICD-10-CM | POA: Diagnosis not present

## 2015-08-11 DIAGNOSIS — M1990 Unspecified osteoarthritis, unspecified site: Secondary | ICD-10-CM | POA: Diagnosis not present

## 2015-08-11 DIAGNOSIS — I10 Essential (primary) hypertension: Secondary | ICD-10-CM | POA: Diagnosis not present

## 2015-08-11 DIAGNOSIS — S76119A Strain of unspecified quadriceps muscle, fascia and tendon, initial encounter: Secondary | ICD-10-CM | POA: Diagnosis present

## 2015-08-11 DIAGNOSIS — M6788 Other specified disorders of synovium and tendon, other site: Secondary | ICD-10-CM | POA: Insufficient documentation

## 2015-08-11 DIAGNOSIS — S76111S Strain of right quadriceps muscle, fascia and tendon, sequela: Secondary | ICD-10-CM | POA: Diagnosis not present

## 2015-08-11 DIAGNOSIS — X58XXXS Exposure to other specified factors, sequela: Secondary | ICD-10-CM | POA: Diagnosis not present

## 2015-08-11 DIAGNOSIS — S76191D Other specified injury of right quadriceps muscle, fascia and tendon, subsequent encounter: Secondary | ICD-10-CM

## 2015-08-11 HISTORY — PX: QUADRICEPS TENDON REPAIR: SHX756

## 2015-08-11 SURGERY — REPAIR, TENDON, QUADRICEPS
Anesthesia: General | Laterality: Right

## 2015-08-11 MED ORDER — METOCLOPRAMIDE HCL 5 MG/ML IJ SOLN: 5.0000 mg | Freq: Three times a day (TID) | INTRAMUSCULAR | Status: DC | PRN

## 2015-08-11 MED ORDER — SODIUM CHLORIDE 0.9 % IV SOLN
INTRAVENOUS | Status: DC
  Administered 2015-08-11 (×2): via INTRAVENOUS
  Filled 2015-08-11 (×4): qty 1000

## 2015-08-11 MED ORDER — KETOROLAC TROMETHAMINE 30 MG/ML IJ SOLN
INTRAMUSCULAR | Status: DC | PRN
  Administered 2015-08-11: 30 mg

## 2015-08-11 MED ORDER — MIDAZOLAM HCL 5 MG/5ML IJ SOLN
INTRAMUSCULAR | Status: DC | PRN
  Administered 2015-08-11: 2 mg via INTRAVENOUS

## 2015-08-11 MED ORDER — ONDANSETRON HCL 4 MG PO TABS: 4.0000 mg | ORAL_TABLET | Freq: Four times a day (QID) | ORAL | Status: DC | PRN

## 2015-08-11 MED ORDER — CELECOXIB 200 MG PO CAPS
200.0000 mg | ORAL_CAPSULE | Freq: Two times a day (BID) | ORAL | Status: DC
  Filled 2015-08-11 (×3): qty 1

## 2015-08-11 MED ORDER — AMLODIPINE BESYLATE 10 MG PO TABS
10.0000 mg | ORAL_TABLET | Freq: Every evening | ORAL | Status: DC
  Administered 2015-08-11: 10 mg via ORAL
  Filled 2015-08-11 (×2): qty 1

## 2015-08-11 MED ORDER — ASPIRIN EC 325 MG PO TBEC
325.0000 mg | DELAYED_RELEASE_TABLET | Freq: Two times a day (BID) | ORAL | Status: DC
  Filled 2015-08-11 (×3): qty 1

## 2015-08-11 MED ORDER — BUPIVACAINE-EPINEPHRINE (PF) 0.25% -1:200000 IJ SOLN
INTRAMUSCULAR | Status: DC | PRN
  Administered 2015-08-11: 30 mL

## 2015-08-11 MED ORDER — DEXAMETHASONE SODIUM PHOSPHATE 10 MG/ML IJ SOLN
10.0000 mg | Freq: Once | INTRAMUSCULAR | Status: AC
  Administered 2015-08-11: 10 mg via INTRAVENOUS

## 2015-08-11 MED ORDER — PROPOFOL 10 MG/ML IV BOLUS
INTRAVENOUS | Status: AC
  Filled 2015-08-11: qty 20

## 2015-08-11 MED ORDER — SODIUM CHLORIDE 0.9 % IJ SOLN
INTRAMUSCULAR | Status: DC | PRN
  Administered 2015-08-11: 30 mL

## 2015-08-11 MED ORDER — DIPHENHYDRAMINE HCL 25 MG PO CAPS: 25.0000 mg | ORAL_CAPSULE | Freq: Four times a day (QID) | ORAL | Status: DC | PRN

## 2015-08-11 MED ORDER — OXYCODONE HCL 5 MG PO TABS
5.0000 mg | ORAL_TABLET | ORAL | Status: DC
  Administered 2015-08-11: 15 mg via ORAL
  Filled 2015-08-11: qty 3

## 2015-08-11 MED ORDER — METHOCARBAMOL 1000 MG/10ML IJ SOLN
500.0000 mg | Freq: Four times a day (QID) | INTRAVENOUS | Status: DC | PRN
  Filled 2015-08-11: qty 5

## 2015-08-11 MED ORDER — ONDANSETRON HCL 4 MG/2ML IJ SOLN
INTRAMUSCULAR | Status: AC
  Filled 2015-08-11: qty 2

## 2015-08-11 MED ORDER — BUPIVACAINE-EPINEPHRINE (PF) 0.25% -1:200000 IJ SOLN
INTRAMUSCULAR | Status: AC
  Filled 2015-08-11: qty 30

## 2015-08-11 MED ORDER — HYDROMORPHONE HCL 1 MG/ML IJ SOLN
INTRAMUSCULAR | Status: DC | PRN
  Administered 2015-08-11 (×6): .2 mg via INTRAVENOUS

## 2015-08-11 MED ORDER — DEXAMETHASONE SODIUM PHOSPHATE 10 MG/ML IJ SOLN: 10.0000 mg | Freq: Once | INTRAMUSCULAR | Status: DC

## 2015-08-11 MED ORDER — ONDANSETRON HCL 4 MG/2ML IJ SOLN
INTRAMUSCULAR | Status: DC | PRN
  Administered 2015-08-11: 4 mg via INTRAVENOUS

## 2015-08-11 MED ORDER — BUPIVACAINE LIPOSOME 1.3 % IJ SUSP
20.0000 mL | Freq: Once | INTRAMUSCULAR | Status: DC
  Filled 2015-08-11: qty 20

## 2015-08-11 MED ORDER — FENTANYL CITRATE (PF) 250 MCG/5ML IJ SOLN
INTRAMUSCULAR | Status: DC | PRN
  Administered 2015-08-11 (×4): 25 ug via INTRAVENOUS

## 2015-08-11 MED ORDER — CEFAZOLIN SODIUM-DEXTROSE 2-3 GM-% IV SOLR
2.0000 g | Freq: Four times a day (QID) | INTRAVENOUS | Status: AC
  Administered 2015-08-11 (×2): 2 g via INTRAVENOUS
  Filled 2015-08-11 (×2): qty 50

## 2015-08-11 MED ORDER — TRANEXAMIC ACID 1000 MG/10ML IV SOLN
1000.0000 mg | Freq: Once | INTRAVENOUS | Status: AC
  Administered 2015-08-11: 1000 mg via INTRAVENOUS
  Filled 2015-08-11: qty 10

## 2015-08-11 MED ORDER — HYDROMORPHONE HCL 1 MG/ML IJ SOLN: 0.5000 mg | INTRAMUSCULAR | Status: DC | PRN

## 2015-08-11 MED ORDER — MAGNESIUM CITRATE PO SOLN: 1.0000 | Freq: Once | ORAL | Status: DC | PRN

## 2015-08-11 MED ORDER — METHOCARBAMOL 500 MG PO TABS
500.0000 mg | ORAL_TABLET | Freq: Four times a day (QID) | ORAL | Status: DC | PRN
  Administered 2015-08-11 – 2015-08-12 (×3): 500 mg via ORAL
  Filled 2015-08-11 (×3): qty 1

## 2015-08-11 MED ORDER — FENTANYL CITRATE (PF) 100 MCG/2ML IJ SOLN
INTRAMUSCULAR | Status: AC
  Administered 2015-08-11: 50 ug
  Filled 2015-08-11: qty 2

## 2015-08-11 MED ORDER — BISACODYL 10 MG RE SUPP: 10.0000 mg | Freq: Every day | RECTAL | Status: DC | PRN

## 2015-08-11 MED ORDER — KETOROLAC TROMETHAMINE 15 MG/ML IJ SOLN
15.0000 mg | Freq: Four times a day (QID) | INTRAMUSCULAR | Status: DC
  Administered 2015-08-11 – 2015-08-12 (×2): 15 mg via INTRAVENOUS
  Filled 2015-08-11 (×4): qty 1

## 2015-08-11 MED ORDER — POLYETHYLENE GLYCOL 3350 17 G PO PACK
17.0000 g | PACK | Freq: Two times a day (BID) | ORAL | Status: DC
  Administered 2015-08-11 – 2015-08-12 (×2): 17 g via ORAL

## 2015-08-11 MED ORDER — OXYCODONE HCL 5 MG PO TABS
5.0000 mg | ORAL_TABLET | ORAL | Status: DC
  Administered 2015-08-11: 20 mg via ORAL
  Filled 2015-08-11: qty 4

## 2015-08-11 MED ORDER — PROMETHAZINE HCL 25 MG/ML IJ SOLN: 6.2500 mg | INTRAMUSCULAR | Status: DC | PRN

## 2015-08-11 MED ORDER — HYDROMORPHONE HCL 1 MG/ML IJ SOLN: 0.2500 mg | INTRAMUSCULAR | Status: DC | PRN

## 2015-08-11 MED ORDER — PHENOL 1.4 % MT LIQD: 1.0000 | OROMUCOSAL | Status: DC | PRN

## 2015-08-11 MED ORDER — METOCLOPRAMIDE HCL 5 MG PO TABS
5.0000 mg | ORAL_TABLET | Freq: Three times a day (TID) | ORAL | Status: DC | PRN
  Filled 2015-08-11: qty 2

## 2015-08-11 MED ORDER — HYDROMORPHONE HCL 2 MG/ML IJ SOLN
INTRAMUSCULAR | Status: AC
  Filled 2015-08-11: qty 1

## 2015-08-11 MED ORDER — FENTANYL CITRATE (PF) 100 MCG/2ML IJ SOLN
INTRAMUSCULAR | Status: AC
  Filled 2015-08-11: qty 4

## 2015-08-11 MED ORDER — PROPOFOL 10 MG/ML IV BOLUS
INTRAVENOUS | Status: DC | PRN
  Administered 2015-08-11: 250 mg via INTRAVENOUS

## 2015-08-11 MED ORDER — CEFAZOLIN SODIUM-DEXTROSE 2-3 GM-% IV SOLR
INTRAVENOUS | Status: AC
  Filled 2015-08-11: qty 50

## 2015-08-11 MED ORDER — SODIUM CHLORIDE 0.9 % IJ SOLN
INTRAMUSCULAR | Status: AC
  Filled 2015-08-11: qty 10

## 2015-08-11 MED ORDER — FENTANYL CITRATE (PF) 100 MCG/2ML IJ SOLN
50.0000 ug | Freq: Once | INTRAMUSCULAR | Status: AC
  Administered 2015-08-11: 50 ug via INTRAVENOUS

## 2015-08-11 MED ORDER — ONDANSETRON HCL 4 MG/2ML IJ SOLN: 4.0000 mg | Freq: Four times a day (QID) | INTRAMUSCULAR | Status: DC | PRN

## 2015-08-11 MED ORDER — ALUM & MAG HYDROXIDE-SIMETH 200-200-20 MG/5ML PO SUSP: 30.0000 mL | ORAL | Status: DC | PRN

## 2015-08-11 MED ORDER — MIDAZOLAM HCL 2 MG/2ML IJ SOLN
INTRAMUSCULAR | Status: AC
  Filled 2015-08-11: qty 4

## 2015-08-11 MED ORDER — METOPROLOL SUCCINATE ER 100 MG PO TB24
100.0000 mg | ORAL_TABLET | Freq: Every evening | ORAL | Status: DC
  Administered 2015-08-11: 100 mg via ORAL
  Filled 2015-08-11 (×2): qty 1

## 2015-08-11 MED ORDER — CHLORHEXIDINE GLUCONATE 4 % EX LIQD: 60.0000 mL | Freq: Once | CUTANEOUS | Status: DC

## 2015-08-11 MED ORDER — DEXAMETHASONE SODIUM PHOSPHATE 10 MG/ML IJ SOLN
INTRAMUSCULAR | Status: AC
  Filled 2015-08-11: qty 1

## 2015-08-11 MED ORDER — FERROUS SULFATE 325 (65 FE) MG PO TABS
325.0000 mg | ORAL_TABLET | Freq: Three times a day (TID) | ORAL | Status: DC
  Filled 2015-08-11 (×5): qty 1

## 2015-08-11 MED ORDER — LACTATED RINGERS IV SOLN
INTRAVENOUS | Status: DC | PRN
  Administered 2015-08-11: 07:00:00 via INTRAVENOUS

## 2015-08-11 MED ORDER — ROCURONIUM BROMIDE 100 MG/10ML IV SOLN
INTRAVENOUS | Status: AC
  Filled 2015-08-11: qty 1

## 2015-08-11 MED ORDER — KETOROLAC TROMETHAMINE 30 MG/ML IJ SOLN
INTRAMUSCULAR | Status: AC
  Filled 2015-08-11: qty 1

## 2015-08-11 MED ORDER — SODIUM CHLORIDE 0.9 % IJ SOLN
INTRAMUSCULAR | Status: AC
  Filled 2015-08-11: qty 50

## 2015-08-11 MED ORDER — MENTHOL 3 MG MT LOZG
1.0000 | LOZENGE | OROMUCOSAL | Status: DC | PRN
Start: 2015-08-11 — End: 2015-08-12

## 2015-08-11 MED ORDER — DOCUSATE SODIUM 100 MG PO CAPS
100.0000 mg | ORAL_CAPSULE | Freq: Two times a day (BID) | ORAL | Status: DC
  Administered 2015-08-11 – 2015-08-12 (×2): 100 mg via ORAL

## 2015-08-11 MED ORDER — CEFAZOLIN SODIUM-DEXTROSE 2-3 GM-% IV SOLR
2.0000 g | INTRAVENOUS | Status: AC
  Administered 2015-08-11: 2 g via INTRAVENOUS

## 2015-08-11 MED ORDER — CLONIDINE HCL 0.2 MG PO TABS
0.2000 mg | ORAL_TABLET | Freq: Two times a day (BID) | ORAL | Status: DC
  Administered 2015-08-11: 0.2 mg via ORAL
  Filled 2015-08-11 (×3): qty 1

## 2015-08-11 MED ORDER — ATORVASTATIN CALCIUM 20 MG PO TABS
20.0000 mg | ORAL_TABLET | Freq: Every day | ORAL | Status: DC
  Administered 2015-08-11: 20 mg via ORAL
  Filled 2015-08-11 (×2): qty 1

## 2015-08-11 SURGICAL SUPPLY — 58 items
BAG ZIPLOCK 12X15 (MISCELLANEOUS) IMPLANT
BANDAGE ELASTIC 6 VELCRO ST LF (GAUZE/BANDAGES/DRESSINGS) ×3 IMPLANT
BANDAGE ESMARK 6X9 LF (GAUZE/BANDAGES/DRESSINGS) ×1 IMPLANT
BIT DRILL 2.8X128 (BIT) ×2 IMPLANT
BIT DRILL 2.8X128MM (BIT) ×1
BNDG ESMARK 6X9 LF (GAUZE/BANDAGES/DRESSINGS) ×3
CUFF TOURN SGL QUICK 34 (TOURNIQUET CUFF) ×2
CUFF TRNQT CYL 34X4X40X1 (TOURNIQUET CUFF) ×1 IMPLANT
DRAPE EXTREMITY T 121X128X90 (DRAPE) ×3 IMPLANT
DRAPE U-SHAPE 47X51 STRL (DRAPES) ×3 IMPLANT
DRSG AQUACEL AG ADV 3.5X10 (GAUZE/BANDAGES/DRESSINGS) ×3 IMPLANT
DRSG TEGADERM 4X4.75 (GAUZE/BANDAGES/DRESSINGS) IMPLANT
DURAPREP 26ML APPLICATOR (WOUND CARE) ×3 IMPLANT
ELECT REM PT RETURN 9FT ADLT (ELECTROSURGICAL) ×3
ELECTRODE REM PT RTRN 9FT ADLT (ELECTROSURGICAL) ×1 IMPLANT
FACESHIELD WRAPAROUND (MASK) ×6 IMPLANT
GAUZE SPONGE 2X2 8PLY STRL LF (GAUZE/BANDAGES/DRESSINGS) IMPLANT
GLOVE BIOGEL PI IND STRL 7.5 (GLOVE) ×7 IMPLANT
GLOVE BIOGEL PI IND STRL 8.5 (GLOVE) ×1 IMPLANT
GLOVE BIOGEL PI INDICATOR 7.5 (GLOVE) ×14
GLOVE BIOGEL PI INDICATOR 8.5 (GLOVE) ×2
GLOVE ECLIPSE 8.0 STRL XLNG CF (GLOVE) IMPLANT
GLOVE ORTHO TXT STRL SZ7.5 (GLOVE) ×6 IMPLANT
GLOVE SURG ORTHO 8.0 STRL STRW (GLOVE) ×3 IMPLANT
GOWN SPEC L3 XXLG W/TWL (GOWN DISPOSABLE) ×6 IMPLANT
GOWN STRL REUS W/TWL LRG LVL3 (GOWN DISPOSABLE) ×3 IMPLANT
IMMOBILIZER KNEE 20 (SOFTGOODS) ×3
IMMOBILIZER KNEE 20 THIGH 36 (SOFTGOODS) ×1 IMPLANT
KIT BASIN OR (CUSTOM PROCEDURE TRAY) ×3 IMPLANT
LIQUID BAND (GAUZE/BANDAGES/DRESSINGS) ×3 IMPLANT
MANIFOLD NEPTUNE II (INSTRUMENTS) ×3 IMPLANT
NDL SAFETY ECLIPSE 18X1.5 (NEEDLE) ×1 IMPLANT
NEEDLE HYPO 18GX1.5 SHARP (NEEDLE) ×2
NEEDLE MA TROC 1/2 CIR (NEEDLE) IMPLANT
NEEDLE SPNL 18GX3.5 QUINCKE PK (NEEDLE) IMPLANT
NS IRRIG 1000ML POUR BTL (IV SOLUTION) ×3 IMPLANT
PACK TOTAL JOINT (CUSTOM PROCEDURE TRAY) ×3 IMPLANT
PASSER SUT SWANSON 36MM LOOP (INSTRUMENTS) ×3 IMPLANT
POSITIONER SURGICAL ARM (MISCELLANEOUS) ×3 IMPLANT
SPONGE GAUZE 2X2 STER 10/PKG (GAUZE/BANDAGES/DRESSINGS)
SPONGE LAP 18X18 X RAY DECT (DISPOSABLE) IMPLANT
STAPLER VISISTAT 35W (STAPLE) IMPLANT
SUT ETHIBOND 2 OS 4 DA (SUTURE) IMPLANT
SUT ETHIBOND 5 LR DA (SUTURE) IMPLANT
SUT FIBERWIRE #2 38 T-5 BLUE (SUTURE) ×6
SUT FIBERWIRE #5 38 BLUE (WIRE) IMPLANT
SUT MNCRL AB 3-0 PS2 18 (SUTURE) ×3 IMPLANT
SUT MNCRL AB 4-0 PS2 18 (SUTURE) ×3 IMPLANT
SUT VIC AB 0 CT1 27 (SUTURE)
SUT VIC AB 0 CT1 27XBRD ANTBC (SUTURE) IMPLANT
SUT VIC AB 1 CT1 36 (SUTURE) ×9 IMPLANT
SUT VIC AB 2-0 CT1 27 (SUTURE) ×4
SUT VIC AB 2-0 CT1 TAPERPNT 27 (SUTURE) ×2 IMPLANT
SUTURE FIBERWR #2 38 T-5 BLUE (SUTURE) ×2 IMPLANT
SYR 30ML LL (SYRINGE) ×3 IMPLANT
SYR 50ML LL SCALE MARK (SYRINGE) ×3 IMPLANT
TOWEL OR 17X26 10 PK STRL BLUE (TOWEL DISPOSABLE) ×3 IMPLANT
WATER STERILE IRR 1500ML POUR (IV SOLUTION) IMPLANT

## 2015-08-11 NOTE — Interval H&P Note (Signed)
History and Physical Interval Note:  08/11/2015 7:06 AM  Phillip L Facemire Sr.  has presented today for surgery, with the diagnosis of RIGHT QUAD PARTIAL TEAR STATUS POST REVISION WITH EXTERIOR LACK  The various methods of treatment have been discussed with the patient and family. After consideration of risks, benefits and other options for treatment, the patient has consented to  Procedure(s): OPEN REPAIR QUADRICEP TENDON REPAIR REVISION (Right) as a surgical intervention .  The patient's history has been reviewed, patient examined, no change in status, stable for surgery.  I have reviewed the patient's chart and labs.  Questions were answered to the patient's satisfaction.     Shelda Pal

## 2015-08-11 NOTE — Transfer of Care (Signed)
Immediate Anesthesia Transfer of Care Note  Patient: [name] Reasons Sr.  Procedure(s) Performed: Procedure(s): OPEN REPAIR QUADRICEP TENDON REPAIR REVISION (Right)  Patient Location: PACU  Anesthesia Type:General  Level of Consciousness: awake, sedated and patient cooperative  Airway & Oxygen Therapy: Patient Spontanous Breathing and Patient connected to face mask oxygen  Post-op Assessment: Report given to RN and Post -op Vital signs reviewed and stable  Post vital signs: Reviewed and stable  Last Vitals:  Filed Vitals:   08/11/15 0509  BP: 146/93  Pulse: 55  Temp: 36.7 C  Resp: 18    Complications: No apparent anesthesia complications

## 2015-08-11 NOTE — Anesthesia Postprocedure Evaluation (Signed)
  Anesthesia Post-op Note  Patient: [name].  Procedure(s) Performed: Procedure(s) (LRB): OPEN REPAIR QUADRICEP TENDON REPAIR REVISION (Right)  Patient Location: PACU  Anesthesia Type: General  Level of Consciousness: awake and alert   Airway and Oxygen Therapy: Patient Spontanous Breathing  Post-op Pain: mild  Post-op Assessment: Post-op Vital signs reviewed, Patient's Cardiovascular Status Stable, Respiratory Function Stable, Patent Airway and No signs of Nausea or vomiting  Last Vitals:  Filed Vitals:   08/11/15 1201  BP: 145/99  Pulse: 50  Temp: 36.3 C  Resp: 16    Post-op Vital Signs: stable   Complications: No apparent anesthesia complications

## 2015-08-11 NOTE — Brief Op Note (Signed)
08/11/2015  9:04 AM  PATIENT:  Phillip Na Sr.  49 y.o. 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, repair and revision  PROCEDURE:  Procedure(s): REVISION OPEN REPAIR QUADRICEP TENDON REPAIR  (Right)  SURGEON:  Surgeon(s) and Role:    * Durene Romans, MD - Primary  PHYSICIAN ASSISTANT: Lanney Gins, PA-C  ANESTHESIA:   general  EBL:  Total I/O In: -  Out: 25 [Blood:25]  BLOOD ADMINISTERED:none  DRAINS: none   LOCAL MEDICATIONS USED:  MARCAINE     SPECIMEN:  No Specimen  DISPOSITION OF SPECIMEN:  N/A  COUNTS:  YES  TOURNIQUET:   Total Tourniquet Time Documented: Thigh (Right) - 54 minutes Total: Thigh (Right) - 54 minutes   DICTATION: .Other Dictation: Dictation Number [id number]  PLAN OF CARE: Admit for overnight observation  PATIENT DISPOSITION:  PACU - hemodynamically stable.   Delay start of Pharmacological VTE agent (>24hrs) due to surgical blood loss or risk of bleeding: no

## 2015-08-11 NOTE — Progress Notes (Signed)
PT Cancellation Note  Patient Details Name: Phillip BEDOYA Sr. MRN: [id number] DOB: 13-Sep-1966   Cancelled Treatment:    Reason Eval/Treat Not Completed: Fatigue/lethargy limiting ability to participate;Pain limiting ability to participate (patient has Bledsoe from previous surgery, [employer] in but did not place it. Will see in AM, place  Bledsoe. )   Hailey Miles, Jobe Igo 08/11/2015, 3:53 PM Blanchard Kelch PT [phone]

## 2015-08-11 NOTE — Anesthesia Procedure Notes (Signed)
Procedure Name: LMA Insertion Date/Time: 08/11/2015 7:36 AM Performed by: Delphia Grates Pre-anesthesia Checklist: Patient identified, Emergency Drugs available, Suction available and Patient being monitored Patient Re-evaluated:Patient Re-evaluated prior to inductionOxygen Delivery Method: Circle system utilized Preoxygenation: Pre-oxygenation with 100% oxygen Intubation Type: IV induction LMA: LMA with gastric port inserted LMA Size: 5.0 Number of attempts: 1 Placement Confirmation: breath sounds checked- equal and bilateral and positive ETCO2 Tube secured with: Tape Dental Injury: Teeth and Oropharynx as per pre-operative assessment

## 2015-08-12 DIAGNOSIS — S76111S Strain of right quadriceps muscle, fascia and tendon, sequela: Secondary | ICD-10-CM | POA: Diagnosis not present

## 2015-08-12 LAB — CBC
HCT: 36.4 % — ABNORMAL LOW (ref 39.0–52.0)
Hemoglobin: 11.9 g/dL — ABNORMAL LOW (ref 13.0–17.0)
MCH: 29.1 pg (ref 26.0–34.0)
MCHC: 32.7 g/dL (ref 30.0–36.0)
MCV: 89 fL (ref 78.0–100.0)
PLATELETS: 286 10*3/uL (ref 150–400)
RBC: 4.09 MIL/uL — ABNORMAL LOW (ref 4.22–5.81)
RDW: 13.4 % (ref 11.5–15.5)
WBC: 11.2 10*3/uL — ABNORMAL HIGH (ref 4.0–10.5)

## 2015-08-12 LAB — BASIC METABOLIC PANEL
Anion gap: 9 (ref 5–15)
BUN: 9 mg/dL (ref 6–20)
CO2: 29 mmol/L (ref 22–32)
CREATININE: 0.93 mg/dL (ref 0.61–1.24)
Calcium: 9.2 mg/dL (ref 8.9–10.3)
Chloride: 104 mmol/L (ref 101–111)
Glucose, Bld: 143 mg/dL — ABNORMAL HIGH (ref 65–99)
Potassium: 2.9 mmol/L — ABNORMAL LOW (ref 3.5–5.1)
SODIUM: 142 mmol/L (ref 135–145)

## 2015-08-12 MED ORDER — POTASSIUM CHLORIDE ER 20 MEQ PO TBCR: 20.0000 meq | EXTENDED_RELEASE_TABLET | Freq: Every day | ORAL | Status: DC

## 2015-08-12 MED ORDER — ASPIRIN 325 MG PO TBEC: 325.0000 mg | DELAYED_RELEASE_TABLET | Freq: Two times a day (BID) | ORAL | Status: AC

## 2015-08-12 MED ORDER — OXYCODONE HCL 10 MG PO TABS: 10.0000 mg | ORAL_TABLET | ORAL | Status: DC | PRN

## 2015-08-12 MED ORDER — CYCLOBENZAPRINE HCL 10 MG PO TABS: 10.0000 mg | ORAL_TABLET | Freq: Three times a day (TID) | ORAL | Status: DC | PRN

## 2015-08-12 MED ORDER — POTASSIUM CHLORIDE CRYS ER 20 MEQ PO TBCR
40.0000 meq | EXTENDED_RELEASE_TABLET | Freq: Two times a day (BID) | ORAL | Status: DC
  Filled 2015-08-12 (×2): qty 2

## 2015-08-12 NOTE — Progress Notes (Signed)
OT Cancellation Note  Patient Details Name: Phillip FITZGIBBON Sr. MRN: [id number] DOB: 03-13-1966   Cancelled Treatment:    Reason Eval/Treat Not Completed: OT screened, no needs identified, will sign off. Pt ambulating in hallway with PT and PT reports that pt and wife have no concerns with ADL at this time. Pt verbalizes the same to OT. He declines a 3in1 and wife can help with other ADL. Will sign off.  Lennox Laity  [phone] 08/12/2015, 1:31 PM

## 2015-08-12 NOTE — Evaluation (Signed)
Physical Therapy Evaluation Patient Details Name: Phillip DUE Sr. MRN: [id number] DOB: [birth year] Today's Date: 08/12/2015   History of Present Illness  s/p quad tendon revision,repair and shortening of tendon  Clinical Impression  Pt moving well but in significant pain d/t not taking meds (declines pain meds); has wife support at home as needed; has RW does not want 3in1, declines stairs and verbalizes technique that he uses and wife reports she is comfortable with this ; pt "maneuvers and figures it out " per wife as he has had multiple surgeries on R knee since the initial injury; no PT  f/u indicated at this time; defer f/u PT to MD    Follow Up Recommendations No PT follow up (may need OPPT later (per MD))    Equipment Recommendations  None recommended by PT    Recommendations for Other Services       Precautions / Restrictions Precautions Precaution Comments: NO ROM R KNEE Required Braces or Orthoses: Other Brace/Splint Other Brace/Splint: T ROM brace Restrictions Weight Bearing Restrictions: No      Mobility  Bed Mobility Overal bed mobility: Modified Independent                Transfers Overall transfer level: Modified independent Equipment used: Rolling walker (2 wheeled)             General transfer comment: pt uses corect hand placement  Ambulation/Gait Ambulation/Gait assistance: Supervision;Modified independent (Device/Increase time) Ambulation Distance (Feet): 50 Feet         General Gait Details: d/t pain level, incr time  Stairs            Wheelchair Mobility    Modified Rankin (Stroke Patients Only)       Balance Overall balance assessment: Needs assistance   Sitting balance-Leahy Scale: Fair                                       Pertinent Vitals/Pain Pain Assessment: 0-10 Pain Score: 8  Pain Location: R knee Pain Descriptors / Indicators: Grimacing;Guarding Pain Intervention(s): Limited  activity within patient's tolerance;Monitored during session;Other (comment) (pt does not want meds bc they "do not work")    Home Living Family/patient expects to be discharged to:: Private residence Living Arrangements: Spouse/significant other   Type of Home: House Home Access: Stairs to enter   Secretary/administrator of Steps: 2 Home Layout: Able to live on main level with bedroom/bathroom Home Equipment: Environmental consultant - 2 wheels;Crutches;Cane - single point      Prior Function Level of Independence: Independent               Hand Dominance        Extremity/Trunk Assessment   Upper Extremity Assessment: Defer to OT evaluation           Lower Extremity Assessment: RLE deficits/detail RLE Deficits / Details: ankle WFL; NT further, uses LLE to lift RLE against gravity -d/t pain       Communication   Communication: No difficulties  Cognition Arousal/Alertness: Awake/alert Behavior During Therapy: WFL for tasks assessed/performed Overall Cognitive Status: Within Functional Limits for tasks assessed                      General Comments      Exercises        Assessment/Plan    PT Assessment Patent does not need any further PT  services  PT Diagnosis Difficulty walking   PT Problem List    PT Treatment Interventions     PT Goals (Current goals can be found in the Care Plan section) Acute Rehab PT Goals Patient Stated Goal: to go home PT Goal Formulation: All assessment and education complete, DC therapy    Frequency     Barriers to discharge        Co-evaluation               End of Session   Activity Tolerance: Patient tolerated treatment well;Patient limited by pain Patient left: Other (comment);with call bell/phone within reach;with family/visitor present (in bathroom to wash up) Nurse Communication: Mobility status         Time: 1610-9604 PT Time Calculation (min) (ACUTE ONLY): 18 min   Charges:   PT Evaluation $Initial  PT Evaluation Tier I: 1 Procedure     PT G CodesDrucilla Chalet 27-Aug-2015, 12:50 PM

## 2015-08-12 NOTE — Progress Notes (Signed)
Patient refused morning medications as follows: ASA 325, Decadron, Celebrex, and Catapres. Also refused oxycodone despite reporting 10/10 pain. Educated patient regarding this.

## 2015-08-12 NOTE — Op Note (Signed)
[name], [name]             ACCOUNT NO.:  [id number]  MEDICAL RECORD NO.:  [id number]  LOCATION:  1603                         FACILITY:  [facility]  PHYSICIAN:  [name], M.D.  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  PROCEDURE:  Revision repair of quad tendon, with an attempted shortening of the extensor mechanism fibers to allow for generation of power to prevent extensor lag.  SURGEON:  [name], M.D.  ASSISTANT:  Lanney Gins, PA-C.  ANESTHESIA:  General.  SPECIMENS:  None.  COMPLICATION:  None.  TOURNIQUET TIME:  54 minutes at 250 mmHg.  INDICATIONS FOR PROCEDURE:  [name] is a 49 year old male with an unfortunate history involving his right leg.  He had a work related injury where he stepped off the back of a truck and had a pop.  He had an initial quad tendon rupture diagnosed and repaired.  When I had seen him, review of operative notes indicated a turndown type procedure.  He was seen and evaluated based on the fact that he had persistence of extensor lag, inability to return to work.  After lengthy discussion, attempts at physical therapy to maximize in an attempt to try to recover his function as a young male getting back to work, we were unsuccessful. This subsequently led to the consideration of a revision repair of the extensor mechanism with an attempt to try to shorten muscle fibers and allow for degenerate his power in his quadriceps muscle.  He had this operation performed where I  debrided the scarring over the infrapatellar region to allow for mobilization of the patella due to radiographic evidence of patella related to the scarring, and I excised a portion of his quadriceps tendon and reapproximated.  Initial results were optimistic and maintained knee extension and function, however, with time, he has developed persistent and recurrent extensor lag.  He is brace dependent and cane dependent.  We had subsequent had further lengthy discussions regarding the prospect of this being the best that he would recover versus an attempted revision of the quadriceps mechanism and attempt to try to maximize the potential generation power.  The goal that has been discussed with him is to allow for adequate quad function to provide stability to his gait with a secondary functional concern of his range of motion.  Without his adequate quad strength in extensor mechanism and extensor function, he will be brace dependent and would not be able to return to work he was doing before.  Risks and benefits were discussed and reviewed.  He has had some issues and has subsequently sought counsel to help get him through this, this has been Worker's Comp case.  The risk of recurrent or persistent extensor lag discussed and reviewed noting that this would most likely be the last  option would have him trying to improve his function.  Risks of infection, DVT, discussed.  PROCEDURE IN DETAIL:  The patient was brought to the operative theater. Once adequate anesthesia, preoperative antibiotics, Ancef administered, he was positioned supine with a right thigh tourniquet placed.  The right lower extremity was then prepped and draped in sterile fashion. Time-out was performed identifying the patient, planned procedure, and extremity.  His old incision was excised.  Soft tissue planes created due to scarring down to the patella region identifying the extensor mechanism both at the quadriceps  and patella level.  I did at this point using Bovie cautery identified functional muscle integrity with some retraction to stimulus both at the medial and lateral quadriceps musculature.  At this point, again as I had done previously, I did create both medial and lateral arthrotomy, I was able to debride the infrapatellar scarring.  I transected the quadriceps tendon just proximal to patella. I was able to mobilize the patella further debriding all scar tissue around the patella in the infrapatellar region further.  Identified significant scarring within the suprapatellar pouch and subsequently re-created despite the scar debridement.  At this point, I excised yet another centimeter of his quadriceps tendon.  I then placed two #1 FiberWire sutures in a Krackow weave pattern through the quadriceps tendon with 4 suture ends exiting distal to the tendon cut.  Three drill holes were then made in the patella and old FiberWire suture was removed.  Then, using the suture passer, the FiberWire sutures were passed, 1 medial, 1 lateral, and 2 central.  Using a bone tenaculum, we stabilized the patella and then reapproximated the quadriceps tendon to the patella using the FiberWire sutures.  There was good bony contact, and please note that I did use a rongeur to remove bone off the proximal patella to get [place] a good subchondral bleeding bone surface.  With the patella now firmly attached to the patella, I used #1 Vicryl suture and reapproximated the superficial portion of quadriceps tendon to the patellar retinacular tissues directly over the transected portion and then reapproximated the medial and lateral retinacular incisions.  The tourniquet was let down after 54 minutes.  There was a good watertight seal to this repair.  We injected into the knee from pain purposes with a combination of 0.25% Marcaine with epinephrine and saline.  At this point, the remaining wound was closed with  2-0 Vicryl, and a running 3-0 Monocryl.  The knee was cleaned, dried, and dressed sterilely using surgical glue and Aquacel dressing.  The patient then had the knee wrapped in Ace wrap.  He was then brought to recovery room in a knee immobilizer with ice packs.  I plan to have him fit for a T-ROM brace locked in extension.  We will maintain full knee extension for 4 weeks before we start physical therapy.  Physical therapy we initiated early for quadriceps exercises, tendon electrical stimulation, but all efforts to try to prevent extension lag formation again.     Phillip Frankel Charlann Conway, M.D.     MDO/MEDQ  D:  08/11/2015  T:  08/12/2015  Job:  [id number]

## 2015-08-12 NOTE — Progress Notes (Signed)
Patient ID: [name]., male   DOB: 1965-11-10, 49 y.o.   MRN: [id number] Subjective: 1 Day Post-Op Procedure(s) (LRB): OPEN REPAIR QUADRICEP TENDON REPAIR REVISION (Right)    Patient reports pain as mild to moderate.  Not much sleep last night.  Ready for breakfast this am. Wife in room.  Waiting for new T-ROM brace  Objective:   VITALS:   Filed Vitals:   08/12/15 0517  BP: 145/78  Pulse: 63  Temp: 98.9 F (37.2 C)  Resp: 16    Neurovascular intact Incision: dressing C/D/I  LABS  Recent Labs  08/12/15 0444  HGB 11.9*  HCT 36.4*  WBC 11.2*  PLT 286     Recent Labs  08/12/15 0444  NA 142  K 2.9*  BUN 9  CREATININE 0.93  GLUCOSE 143*    No results for input(s): LABPT, INR in the last 72 hours.   Assessment/Plan: 1 Day Post-Op Procedure(s) (LRB): OPEN REPAIR QUADRICEP TENDON REPAIR REVISION (Right)   Advance diet Up with therapy Discharge home today after being fit with new T-ROM brace (important as old one is year out and imperative to maintain knee extension)  PT order through our office to start when possible  K+ - 2.9 - will supplement today and at home for 2 weeks

## 2015-08-12 NOTE — Plan of Care (Signed)
Problem: Discharge Progression Outcomes Goal: Pain controlled with appropriate interventions Outcome: Not Met (add Reason) Patient refuses pain medication, despite pain being 10/10.

## 2015-08-12 NOTE — Care Management Note (Signed)
Case Management Note  Patient Details  Name: Phillip BUGARIN Sr. MRN: [id number] Date of Birth: April 11, 1966  Subjective/Objective:                   OPEN REPAIR QUADRICEP TENDON REPAIR REVISION (Right) Action/Plan: Discharge planning  Expected Discharge Date:  08/12/15               Expected Discharge Plan:  Home/Self Care  In-House Referral:     Discharge planning Services  CM Consult  Post Acute Care Choice:    Choice offered to:     DME Arranged:    DME Agency:     HH Arranged:    HH Agency:     Status of Service:  Completed, signed off  Medicare Important Message Given:    Date Medicare IM Given:    Medicare IM give by:    Date Additional Medicare IM Given:    Additional Medicare Important Message give by:     If discussed at Long Length of Stay Meetings, dates discussed:    Additional Comments: CM confirmed with spouse of pt (insidsposed) pt has rolling walker at home and does not want 3n1.  No OT/PT/DME recc or ordered.  No other CM needs were communicated. Yves Dill, RN 08/12/2015, 1:39 PM

## 2015-08-18 NOTE — Discharge Summary (Signed)
Physician Discharge Summary  Patient ID: Phillip Naemetrius L Clear Sr. MRN: [id number] DOB/AGE: 1965-12-23 49 y.o.  Admit date: 08/11/2015 Discharge date: 08/12/2015   Procedures:  Procedure(s) (LRB): OPEN REPAIR QUADRICEP TENDON REPAIR REVISION (Right)  Attending Physician:  Dr. Durene RomansMatthew Olin   Admission Diagnoses:   S/P revision of right quad repair with lengthening and persistent extensor lag  Discharge Diagnoses:  Active Problems:   Quadriceps tendon rupture   Inj right quadriceps muscle, fascia and tendon, subs encntr  Past Medical History  Diagnosis Date  . Hypertension   . Headache(784.0)     hx migraines  . Arthritis   . Tendon tear     rt leg  . Frequency of urination   . Difficulty sleeping     due to pain  . Heart murmur     as a child     HPI:    Pt is a 49 y.o. male complaining of right leg pain since September 22, 2012. While he was stepping down from the tractor trailer to had to step away and his knee buckled. He subsequent had a turndown quad repair. Since that time he has had issues with instability, persistent extensor lag and pain Pain had continually increased since the beginning. Pt has tried various conservative treatments which have failed to alleviate their symptoms. On 08/05/2014 he underwent a revision quad tendon repair with medial open release of scar per Dr. Charlann Boxerlin. Upon return to the clinic recent he continues to have extensor lag in the right leg. Continues to have to use assistance devices and braces. Options have been discussed including risks, benefits and expectations.Patient understand the risks, benefits and expectations and wishes to proceed with surgery.   PCP: Devra DoppHOWELL, TAMIEKA, MD   Discharged Condition: good  Hospital Course:  Patient underwent the above stated procedure on 08/11/2015. Patient tolerated the procedure well and brought to the recovery room in good condition and subsequently to the floor.  POD #1 BP: 145/78 ; Pulse:  63 ; Temp: 98.9 F (37.2 C) ; Resp: 16 Patient reports pain as mild to moderate. Not much sleep last night. Ready for breakfast this am. Wife in room. Waiting for new T-ROM brace. Neurovascular intact and incision: dressing C/D/I  LABS  Basename    HGB  11.9  HCT  36.4    Discharge Exam: General appearance: alert, cooperative and no distress Extremities: Homans sign is negative, no sign of DVT, no edema, redness or tenderness in the calves or thighs and no ulcers, gangrene or trophic changes  Disposition: Home with follow up in 2 weeks   Follow-up Information    Follow up with Shelda PalLIN,Hala Narula D, MD. Schedule an appointment as soon as possible for a visit in 2 weeks.   Specialty:  Orthopedic Surgery   Contact information:   [address] [place] [address] [phone]       Discharge Instructions    Call MD / Call 911    Complete by:  As directed   If you experience chest pain or shortness of breath, CALL 911 and be transported to the hospital emergency room.  If you develope a fever above 101 F, pus (white drainage) or increased drainage or redness at the wound, or calf pain, call your surgeon's office.     Constipation Prevention    Complete by:  As directed   Drink plenty of fluids.  Prune juice may be helpful.  You may use a stool softener, such as Colace (over  the counter) 100 mg twice a day.  Use MiraLax (over the counter) for constipation as needed.     Diet - low sodium heart healthy    Complete by:  As directed      Discharge instructions    Complete by:  As directed   Maintain surgical dressing until follow up in the clinic. If the edges start to pull up, may reinforce with tape. If the dressing is no longer working, may remove and cover with gauze and tape, but must keep the area dry and clean.  Follow up in 2 weeks at [facility]. Call with any questions or concerns.     Weight bearing as tolerated    Complete by:  As directed    Laterality:  right  Extremity:  Lower  Bledsoe brace in place at all times.             Medication List    STOP taking these medications        aspirin-acetaminophen-caffeine 250-250-65 MG tablet  Commonly known as:  EXCEDRIN MIGRAINE      TAKE these medications        amLODipine 10 MG tablet  Commonly known as:  NORVASC  Take 10 mg by mouth every evening.     aspirin 325 MG EC tablet  Take 1 tablet (325 mg total) by mouth 2 (two) times daily.     atorvastatin 20 MG tablet  Commonly known as:  LIPITOR  Take 20 mg by mouth daily.     cloNIDine 0.2 MG tablet  Commonly known as:  CATAPRES  Take 0.2 mg by mouth 2 (two) times daily.     cyclobenzaprine 10 MG tablet  Commonly known as:  FLEXERIL  Take 1 tablet (10 mg total) by mouth 3 (three) times daily as needed for muscle spasms.     DSS 100 MG Caps  Take 100 mg by mouth 2 (two) times daily.     ferrous sulfate 325 (65 FE) MG tablet  Take 1 tablet (325 mg total) by mouth 3 (three) times daily after meals.     lisinopril 40 MG tablet  Commonly known as:  PRINIVIL,ZESTRIL  Take 40 mg by mouth every evening.     metoprolol succinate 100 MG 24 hr tablet  Commonly known as:  TOPROL-XL  Take 100 mg by mouth every evening. Take with or immediately following a meal.     Oxycodone HCl 10 MG Tabs  Take 1-2 tablets (10-20 mg total) by mouth every 4 (four) hours as needed for severe pain.     polyethylene glycol packet  Commonly known as:  MIRALAX / GLYCOLAX  Take 17 g by mouth 2 (two) times daily.     Potassium Chloride ER 20 MEQ Tbcr  Take 20 mEq by mouth daily.         Signed: Anastasio Auerbach. Phillip Sappington   PA-C  08/18/2015, 5:11 PM

## 2017-08-21 HISTORY — PX: REPAIR QUADRICEPS/HAMSTRING MUSCLES: SHX6563

## 2017-09-19 ENCOUNTER — Encounter: Payer: Self-pay | Admitting: Family Medicine

## 2017-09-19 ENCOUNTER — Ambulatory Visit (INDEPENDENT_AMBULATORY_CARE_PROVIDER_SITE_OTHER): Payer: 59 | Admitting: Family Medicine

## 2017-09-19 VITALS — BP 130/80 | HR 81 | Temp 97.8°F | Ht 75.0 in | Wt 254.2 lb

## 2017-09-19 DIAGNOSIS — S76111S Strain of right quadriceps muscle, fascia and tendon, sequela: Secondary | ICD-10-CM

## 2017-09-19 DIAGNOSIS — I10 Essential (primary) hypertension: Secondary | ICD-10-CM

## 2017-09-19 DIAGNOSIS — E785 Hyperlipidemia, unspecified: Secondary | ICD-10-CM | POA: Diagnosis not present

## 2017-09-19 LAB — BASIC METABOLIC PANEL
BUN: 13 mg/dL (ref 6–23)
CALCIUM: 9.7 mg/dL (ref 8.4–10.5)
CO2: 29 mEq/L (ref 19–32)
Chloride: 103 mEq/L (ref 96–112)
Creatinine, Ser: 1.13 mg/dL (ref 0.40–1.50)
GFR: 87.58 mL/min (ref >60.00)
GLUCOSE: 102 mg/dL — AB (ref 70–99)
POTASSIUM: 3.4 meq/L — AB (ref 3.5–5.1)
SODIUM: 142 meq/L (ref 135–145)

## 2017-09-19 LAB — HEPATIC FUNCTION PANEL
ALBUMIN: 4.5 g/dL (ref 3.5–5.2)
ALT: 14 U/L (ref 0–53)
AST: 16 U/L (ref 0–37)
Alkaline Phosphatase: 77 U/L (ref 39–117)
BILIRUBIN TOTAL: 0.6 mg/dL (ref 0.2–1.2)
Bilirubin, Direct: 0.1 mg/dL (ref 0.0–0.3)
Total Protein: 7.3 g/dL (ref 6.0–8.3)

## 2017-09-19 LAB — LIPID PANEL
CHOL/HDL RATIO: 3
CHOLESTEROL: 137 mg/dL (ref 0–200)
HDL: 43.4 mg/dL (ref >39.00)
LDL CALC: 68 mg/dL (ref 0–99)
NonHDL: 93.55
TRIGLYCERIDES: 127 mg/dL (ref 0.0–149.0)
VLDL: 25.4 mg/dL (ref 0.0–40.0)

## 2017-09-19 NOTE — Progress Notes (Signed)
   Subjective:    Patient ID: Marcello Fennelemetrius L Alves Sr., male    DOB: [birth year], 51 y.o.   MRN: [id number]  HPI 51 yr old male to establish with us after transferring from Dr. Devra Doppamieka Howell. He is doing well in general, with his only ongoing problem being his right leg. 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 He has had 6 different surgeries on this since then, and only after the last one has he seen much signs of improvement. heh as been on disability since then. He wears a brace and walks with crutches. His last surgery was earlier this year [facility]Brian [facility], and he has been in PT since then. He has not had a well exam for several years but he has been up to date with lab work. His last A1c on 04-19-17 was 6.1 (he has a strong family hx of diabetes). His last lipid panel on 01-18-17 showed a TC of 160, an HDL of 52, a TG of 162, and an LDL of 81. He has never had a colonoscopy but he had a negative Cologuard test on 04-20-17. His BP has been very well controlled. His migraines are infrequent.    Review of Systems  Constitutional: Negative.   Respiratory: Negative.   Cardiovascular: Negative.   Gastrointestinal: Negative.   Genitourinary: Negative.   Musculoskeletal: Positive for arthralgias and gait problem.  Neurological: Positive for headaches. Negative for dizziness, tremors, seizures, syncope, facial asymmetry, speech difficulty, weakness, light-headedness and numbness.       Objective:   Physical Exam  Constitutional: He is oriented to person, place, and time. He appears well-developed and well-nourished.  Neck: No thyromegaly present.  Cardiovascular: Normal rate, regular rhythm, normal heart sounds and intact distal pulses.  Pulmonary/Chest: Effort normal and breath sounds normal. No respiratory distress. He has no wheezes. He has no rales.  Lymphadenopathy:    He has no  cervical adenopathy.  Neurological: He is alert and oriented to person, place, and time.          Assessment & Plan:  Intro visit for this patient with stable ongoing issues like dyslipidemia and HTN. He is recovering from his 6th surgery for a torn right quadriceps tendon.  Gershon CraneStephen Fry, MD

## 2017-10-31 ENCOUNTER — Telehealth: Payer: Self-pay | Admitting: Family Medicine

## 2017-10-31 NOTE — Telephone Encounter (Signed)
LOV 09/19/17 with Dr. Clent RidgesFry / Refill request for Clonidine / This medication is showing as a historic med / Please advise

## 2017-10-31 NOTE — Telephone Encounter (Signed)
Copied from CRM [id number]. Topic: Quick Communication - See Telephone Encounter >> Oct 31, 2017  3:49 PM Oneal GroutSebastian, Jennifer S wrote: CRM for notification. See Telephone encounter for: Requesting refill on cloNIDine (CATAPRES) 0.2 MG tablet, please call into [facility] [phone] 10/31/17.

## 2017-11-02 NOTE — Telephone Encounter (Signed)
Sent to PCP for approval.  

## 2017-11-03 MED ORDER — CLONIDINE HCL 0.2 MG PO TABS: 0.2000 mg | ORAL_TABLET | Freq: Two times a day (BID) | ORAL | 3 refills | Status: DC

## 2017-11-03 NOTE — Telephone Encounter (Signed)
He takes this BID. Call in #[phone] with 3 rf

## 2018-02-14 ENCOUNTER — Ambulatory Visit: Payer: Self-pay | Admitting: *Deleted

## 2018-02-14 NOTE — Telephone Encounter (Signed)
Pt called because he is having headaches; he sates that originally he takes clonidine but his medication was switched to clonidine hydrochloride and this causes him to have headaches; he states that the pills use to be white and now they are orange; the pt states that he has been having a headache for the past 3 days rated as mild; he states that he has take 6 of the orange pills in the past 6 days; pt would like for Dr Clent RidgesFry to call him because they discussed the effect that this medication has on him; the pt also states that he has not taken his blood pressure; told pt that that I will pass this information on to office for provider review and  He should expect a call from the office or this triage RN within 1hour; he verbalizes understanding but would like Dr Clent RidgesFry to call him back; spoke with Nettie ElmSylvia and she will route to provider; the pt can be contacted at [phone]; will also contact pharmacy to see why medication was changed.

## 2018-02-14 NOTE — Telephone Encounter (Signed)
Spoke with Misty StanleyLisa, Pharmacologistharmacy Technician, at [facility], and she states that clonidine has HCL it; also spoke with Harle Stanfordhea Knox, Pharmacist and she states that all clonidine has HCL attached, and that the previous tablet was (white, oval) made by [employer], and the current tablet (orange, round) is made by [employer]; will route to provider for review; will route to office for provider review, and will contact the pt make him aware that this information has been sent to his provider.

## 2018-02-14 NOTE — Telephone Encounter (Signed)
Sent to PCP ?

## 2018-02-14 NOTE — Telephone Encounter (Signed)
He needs an OV for this, we need to know his BP etc.

## 2018-02-14 NOTE — Telephone Encounter (Signed)
Attempted to contact pt regarding medication and to make him aware that information has been sent to provider; left message on voice mail [phone].

## 2018-02-15 NOTE — Telephone Encounter (Signed)
Called pt and left a VM that they will need to schedule for an OV with Dr. Clent RidgesFry. Advised pt to call back to set up their appointment.

## 2018-02-21 ENCOUNTER — Encounter: Payer: Self-pay | Admitting: Family Medicine

## 2018-02-21 ENCOUNTER — Ambulatory Visit (INDEPENDENT_AMBULATORY_CARE_PROVIDER_SITE_OTHER): Payer: 59 | Admitting: Family Medicine

## 2018-02-21 VITALS — BP 120/80 | HR 66 | Temp 98.4°F | Ht 75.0 in | Wt 260.4 lb

## 2018-02-21 DIAGNOSIS — I10 Essential (primary) hypertension: Secondary | ICD-10-CM | POA: Diagnosis not present

## 2018-02-21 MED ORDER — CLONIDINE HCL 0.2 MG PO TABS: 0.2000 mg | ORAL_TABLET | Freq: Two times a day (BID) | ORAL | 1 refills | Status: DC

## 2018-02-21 NOTE — Progress Notes (Signed)
   Subjective:    Patient ID: Phillip Fennelemetrius L Louderback Sr., male    DOB: 1966/04/29, 52 y.o.   MRN: [id number]  HPI Here to talk about his BP meds. He had been switched by his previous PCP from name brand Catapres to generic Clonidine because the Catapres gave him headaches. However he recently was sent a supply of Catapres again by his mail order pharmacy. He has tried it and it again has caused headaches.    Review of Systems  Constitutional: Negative.   Respiratory: Negative.   Cardiovascular: Negative.   Neurological: Positive for headaches.       Objective:   Physical Exam  Constitutional: He is oriented to person, place, and time. He appears well-developed and well-nourished.  Cardiovascular: Normal rate, regular rhythm, normal heart sounds and intact distal pulses.  Pulmonary/Chest: Effort normal and breath sounds normal. No respiratory distress. He has no wheezes. He has no rales.  Neurological: He is alert and oriented to person, place, and time.          Assessment & Plan:  His HTN is well controlled but he cannot tolerate Catapres. We will send in a short term supply of generic clonidine to his local pharmacy, and we will straighten out this issue with his mail order pharmacy. Gershon CraneStephen Fry, MD

## 2018-04-28 ENCOUNTER — Other Ambulatory Visit: Payer: Self-pay | Admitting: Family Medicine

## 2018-04-28 NOTE — Telephone Encounter (Signed)
Copied from CRM [id number]. Topic: Quick Communication - Rx Refill/Question >> Apr 28, 2018 10:33 AM Tamela OddiMartin, Don'Quashia, NT wrote: Medication:metoprolol succinate (TOPROL-XL) 100 MG 24 hr tablet, lisinopril (PRINIVIL,ZESTRIL) 40 MG tablet, amLODipine (NORVASC) 10 MG tablet  Has the patient contacted their pharmacy? Yes.  States that the RX didn't get faxed back to the mail services  (Agent: If no, request that the patient contact the pharmacy for the refill.) (Agent: If yes, when and what did the pharmacy advise?)  Preferred Pharmacy (with phone number or street name): [facility] FA[facility] # [phone]    Agent: Please be advised that RX refills may take up to 3 business days. We ask that you follow-up with your pharmacy.   Patient states the cloNIDine (CATAPRES) 0.2 MG tablet has Hydrochloride in it and it gives him headaches. Patient is wondering if he can get this medication without hydrochloride in it. If so please send a new RX to the above pharmacy   Please call patient when this RX is sent. He has no pills left.

## 2018-04-29 NOTE — Telephone Encounter (Signed)
Patient called, left VM to return the call to the office to discuss Rx refills. Patient called on 04/28/18 and says he has a headache with clonidine, because of the hydrochloride in it and wanting this medication without hydrochloride. Will need clarification on what he is speaking of. The last OV 02/21/18, the clonidine was ordered due to headaches with brand name Catapres gave him headaches.   Clonidine last refill 02/21/18 #60/1 refill  Metoprolol refill last 08/25/15 by historical provider  Lisinopril last refill 07/22/14 by historical provider  Amlodipine last refill 07/22/14 by historical provider  PCP:[facility] [place] - [id number] AT Portal to [facility] [phone] (Phone) [phone] (Fax)

## 2018-05-01 NOTE — Telephone Encounter (Signed)
Last OV   Clonidine last refilled 02/21/2018 disp 60 with 1 refill   Sent to PCP for approval

## 2018-05-03 ENCOUNTER — Telehealth: Payer: Self-pay | Admitting: Family Medicine

## 2018-05-03 MED ORDER — CLONIDINE HCL 0.2 MG PO TABS: 0.2000 mg | ORAL_TABLET | Freq: Two times a day (BID) | ORAL | 3 refills | Status: DC

## 2018-05-03 MED ORDER — LISINOPRIL 40 MG PO TABS: 40.0000 mg | ORAL_TABLET | Freq: Every evening | ORAL | 3 refills | Status: DC

## 2018-05-03 MED ORDER — AMLODIPINE BESYLATE 10 MG PO TABS: 10.0000 mg | ORAL_TABLET | Freq: Every evening | ORAL | 3 refills | Status: DC

## 2018-05-03 MED ORDER — METOPROLOL SUCCINATE ER 100 MG PO TB24: 100.0000 mg | ORAL_TABLET | Freq: Every evening | ORAL | 3 refills | Status: DC

## 2018-05-03 MED ORDER — CLONIDINE HCL 0.2 MG PO TABS
0.2000 mg | ORAL_TABLET | Freq: Two times a day (BID) | ORAL | 3 refills | Status: DC
Start: 2018-05-03 — End: 2018-05-03

## 2018-05-03 NOTE — Telephone Encounter (Signed)
Rxs sent to [facility]. 

## 2018-05-03 NOTE — Addendum Note (Signed)
Addended by: Marvene StaffOX, SHEENA H on: 05/03/2018 10:14 AM   Modules accepted: Orders

## 2018-05-03 NOTE — Telephone Encounter (Signed)
Clonidine rx sent to mail order per pt request.   Dr. Clent RidgesFry - Pt reports HCTZ gives him headaches. He would like to know what else he could take. Please advise. Thanks!

## 2018-05-03 NOTE — Telephone Encounter (Signed)
Copied from CRM [id number]. Topic: Quick Communication - See Telephone Encounter >> May 03, 2018  3:00 PM Windy KalataMichael, Faylynn Stamos L, NT wrote: CRM for notification. See Telephone encounter for: 05/03/18.  Patient is calling and states the following [facility] and [facility]. Please advise.  amLODipine (NORVASC) 10 MG tablet  lisinopril (PRINIVIL,ZESTRIL) 40 MG tablet  metoprolol succinate (TOPROL-XL) 100 MG 24 hr tablet  [facility] [place] [place] - [id number] AT Portal to Registered [facility] Sites [address] [place] [id number] Phone: [phone] Fax: [phone]

## 2018-05-05 NOTE — Telephone Encounter (Signed)
disregard

## 2018-05-05 NOTE — Telephone Encounter (Signed)
His BP has been well controlled. Tell him to simply stop the Clonidine altogether and stay on his other meds. Watch the BP and he should follow up with us if it goes up

## 2018-07-05 ENCOUNTER — Encounter: Payer: Self-pay | Admitting: Family Medicine

## 2018-07-05 ENCOUNTER — Ambulatory Visit (INDEPENDENT_AMBULATORY_CARE_PROVIDER_SITE_OTHER): Payer: 59 | Admitting: Family Medicine

## 2018-07-05 VITALS — BP 138/96 | HR 77 | Temp 98.6°F | Ht 75.0 in | Wt 260.6 lb

## 2018-07-05 DIAGNOSIS — I10 Essential (primary) hypertension: Secondary | ICD-10-CM

## 2018-07-05 MED ORDER — METOPROLOL SUCCINATE ER 100 MG PO TB24: ORAL_TABLET | ORAL | 3 refills | Status: DC

## 2018-07-05 MED ORDER — CLONIDINE HCL 0.2 MG PO TABS: 0.2000 mg | ORAL_TABLET | Freq: Two times a day (BID) | ORAL | 3 refills | Status: DC

## 2018-07-05 NOTE — Progress Notes (Signed)
   Subjective:    Patient ID: [name]., male    DOB: 1966-06-29, 52 y.o.   MRN: [id number]  HPI Here to follow up HTN. At home his BP goes up and down and he often gets readings in the 130s over 90s. He feels fine other than his chronic knee pain.    Review of Systems  Constitutional: Negative.   Respiratory: Negative.   Cardiovascular: Negative.   Neurological: Negative.        Objective:   Physical Exam  Constitutional: He is oriented to person, place, and time. He appears well-developed and well-nourished.  Cardiovascular: Normal rate, regular rhythm, normal heart sounds and intact distal pulses.  Pulmonary/Chest: Effort normal and breath sounds normal.  Musculoskeletal: He exhibits no edema.  Neurological: He is alert and oriented to person, place, and time.          Assessment & Plan:  For the HTN we will increase Metoprolol succinate 100 mg to take 1.5 tabs a day (150 mg) and he will report back  In 2 weeks.  Gershon Crane, MD

## 2018-07-10 ENCOUNTER — Telehealth: Payer: Self-pay | Admitting: Family Medicine

## 2018-07-10 NOTE — Telephone Encounter (Signed)
Patient states that the generic Clonodine that he was switched to last week, the pharmacy states they are out of it and isn't sure when they will get any in.  Patient is requesting a call on what his options would be on this medication. Patient is out of his medication.

## 2018-07-10 NOTE — Telephone Encounter (Signed)
I did try to call pt but it went to VM asked for pt to call us back. What we could do is resent the RX to another pharmacy.

## 2018-07-11 NOTE — Telephone Encounter (Signed)
Called pt and left a VM to call back. CRM created and sent to PEC pool. 

## 2018-07-12 NOTE — Telephone Encounter (Signed)
calle dpt and left a VM to call back. Will try again later.

## 2018-07-13 NOTE — Telephone Encounter (Signed)
Called pt and left a VM to call back.  

## 2018-07-28 NOTE — Telephone Encounter (Signed)
I have called and lmom to make the pt aware of refill that has been sent in and to cal lif anything further is needed.

## 2018-11-06 ENCOUNTER — Telehealth: Payer: Self-pay | Admitting: Family Medicine

## 2018-11-06 MED ORDER — ATORVASTATIN CALCIUM 20 MG PO TABS: 20.0000 mg | ORAL_TABLET | Freq: Every day | ORAL | 0 refills | Status: DC

## 2018-11-06 NOTE — Telephone Encounter (Signed)
Refill sent to the pharmacy for 30 day supply. Pt will need to come in for CPX for further refills

## 2018-11-06 NOTE — Telephone Encounter (Signed)
Patient needs a refill on Atorvastatin.   [facility] [address]

## 2018-11-16 ENCOUNTER — Other Ambulatory Visit: Payer: Self-pay | Admitting: Family Medicine

## 2018-12-14 ENCOUNTER — Ambulatory Visit (INDEPENDENT_AMBULATORY_CARE_PROVIDER_SITE_OTHER): Payer: 59 | Admitting: Family Medicine

## 2018-12-14 ENCOUNTER — Encounter: Payer: Self-pay | Admitting: Family Medicine

## 2018-12-14 VITALS — BP 130/80 | HR 70 | Temp 98.5°F | Wt 266.0 lb

## 2018-12-14 DIAGNOSIS — J019 Acute sinusitis, unspecified: Secondary | ICD-10-CM | POA: Diagnosis not present

## 2018-12-14 DIAGNOSIS — Z125 Encounter for screening for malignant neoplasm of prostate: Secondary | ICD-10-CM

## 2018-12-14 DIAGNOSIS — Z0000 Encounter for general adult medical examination without abnormal findings: Secondary | ICD-10-CM | POA: Diagnosis not present

## 2018-12-14 LAB — PSA: PSA: 1.29 ng/mL (ref 0.10–4.00)

## 2018-12-14 LAB — CBC WITH DIFFERENTIAL/PLATELET
Basophils Absolute: 0 10*3/uL (ref 0.0–0.1)
Basophils Relative: 0.3 % (ref 0.0–3.0)
Eosinophils Absolute: 0.2 10*3/uL (ref 0.0–0.7)
Eosinophils Relative: 2.8 % (ref 0.0–5.0)
HEMATOCRIT: 38.9 % — AB (ref 39.0–52.0)
Hemoglobin: 13.3 g/dL (ref 13.0–17.0)
LYMPHS PCT: 51 % — AB (ref 12.0–46.0)
Lymphs Abs: 4.2 10*3/uL — ABNORMAL HIGH (ref 0.7–4.0)
MCHC: 34.3 g/dL (ref 30.0–36.0)
MCV: 86.5 fl (ref 78.0–100.0)
Monocytes Absolute: 0.6 10*3/uL (ref 0.1–1.0)
Monocytes Relative: 7.3 % (ref 3.0–12.0)
Neutro Abs: 3.2 10*3/uL (ref 1.4–7.7)
Neutrophils Relative %: 38.6 % — ABNORMAL LOW (ref 43.0–77.0)
Platelets: 328 10*3/uL (ref 150.0–400.0)
RBC: 4.49 Mil/uL (ref 4.22–5.81)
RDW: 13.8 % (ref 11.5–15.5)
WBC: 8.2 10*3/uL (ref 4.0–10.5)

## 2018-12-14 LAB — POC URINALSYSI DIPSTICK (AUTOMATED)
Bilirubin, UA: NEGATIVE
Blood, UA: NEGATIVE
Glucose, UA: NEGATIVE
KETONES UA: NEGATIVE
LEUKOCYTES UA: NEGATIVE
NITRITE UA: NEGATIVE
PH UA: 6.5 (ref 5.0–8.0)
Protein, UA: NEGATIVE
Spec Grav, UA: 1.015 (ref 1.010–1.025)
Urobilinogen, UA: 0.2 E.U./dL

## 2018-12-14 LAB — LDL CHOLESTEROL, DIRECT: Direct LDL: 76 mg/dL

## 2018-12-14 LAB — BASIC METABOLIC PANEL
BUN: 14 mg/dL (ref 6–23)
CHLORIDE: 100 meq/L (ref 96–112)
CO2: 31 meq/L (ref 19–32)
CREATININE: 1.33 mg/dL (ref 0.40–1.50)
Calcium: 10 mg/dL (ref 8.4–10.5)
GFR: 67.95 mL/min (ref >60.00)
Glucose, Bld: 105 mg/dL — ABNORMAL HIGH (ref 70–99)
POTASSIUM: 3.4 meq/L — AB (ref 3.5–5.1)
Sodium: 142 mEq/L (ref 135–145)

## 2018-12-14 LAB — HEPATIC FUNCTION PANEL
ALT: 22 U/L (ref 0–53)
AST: 21 U/L (ref 0–37)
Albumin: 4.8 g/dL (ref 3.5–5.2)
Alkaline Phosphatase: 61 U/L (ref 39–117)
BILIRUBIN TOTAL: 0.3 mg/dL (ref 0.2–1.2)
Bilirubin, Direct: 0.1 mg/dL (ref 0.0–0.3)
Total Protein: 7.6 g/dL (ref 6.0–8.3)

## 2018-12-14 LAB — LIPID PANEL
Cholesterol: 175 mg/dL (ref 0–200)
HDL: 46.2 mg/dL (ref >39.00)
NONHDL: 128.5
TRIGLYCERIDES: 234 mg/dL — AB (ref 0.0–149.0)
Total CHOL/HDL Ratio: 4
VLDL: 46.8 mg/dL — AB (ref 0.0–40.0)

## 2018-12-14 LAB — TSH: TSH: 3.34 u[IU]/mL (ref 0.35–4.50)

## 2018-12-14 MED ORDER — AZITHROMYCIN 250 MG PO TABS: ORAL_TABLET | ORAL | 0 refills | Status: DC

## 2018-12-14 MED ORDER — ATORVASTATIN CALCIUM 20 MG PO TABS: 20.0000 mg | ORAL_TABLET | Freq: Every day | ORAL | 3 refills | Status: DC

## 2018-12-14 NOTE — Progress Notes (Signed)
Subjective:    Patient ID: Marcello Fennel., male    DOB: 04-10-1966, 53 y.o.   MRN: [id number]  HPI Here for a well exam and to discuss 4 weeks of sinus congestion with PND and coughing up yellow sputum. No fever. Using Coricidin. Otherwise he is doing well. His right knee pain is stable.    Review of Systems  Constitutional: Negative.   HENT: Positive for congestion, postnasal drip and sinus pressure.   Eyes: Negative.   Respiratory: Positive for cough.   Cardiovascular: Negative.   Gastrointestinal: Negative.   Genitourinary: Negative.   Musculoskeletal: Positive for arthralgias.  Skin: Negative.   Neurological: Negative.   Psychiatric/Behavioral: Negative.        Objective:   Physical Exam Constitutional:      General: He is not in acute distress.    Appearance: He is well-developed. He is not diaphoretic.     Comments: Walks with a cane   HENT:     Head: Normocephalic and atraumatic.     Right Ear: External ear normal.     Left Ear: External ear normal.     Nose: Nose normal.     Mouth/Throat:     Pharynx: No oropharyngeal exudate.  Eyes:     General: No scleral icterus.       Right eye: No discharge.        Left eye: No discharge.     Conjunctiva/sclera: Conjunctivae normal.     Pupils: Pupils are equal, round, and reactive to light.  Neck:     Musculoskeletal: Neck supple.     Thyroid: No thyromegaly.     Vascular: No JVD.     Trachea: No tracheal deviation.  Cardiovascular:     Rate and Rhythm: Normal rate and regular rhythm.     Heart sounds: Normal heart sounds. No murmur. No friction rub. No gallop.   Pulmonary:     Effort: Pulmonary effort is normal. No respiratory distress.     Breath sounds: Normal breath sounds. No wheezing or rales.  Chest:     Chest wall: No tenderness.  Abdominal:     General: Bowel sounds are normal. There is no distension.     Palpations: Abdomen is soft. There is no mass.     Tenderness: There is no abdominal  tenderness. There is no guarding or rebound.  Genitourinary:    Penis: Normal. No tenderness.      Prostate: Normal.     Rectum: Normal. Guaiac result negative.  Musculoskeletal: Normal range of motion.        General: No tenderness.  Lymphadenopathy:     Cervical: No cervical adenopathy.  Skin:    General: Skin is warm and dry.     Coloration: Skin is not pale.     Findings: No erythema or rash.  Neurological:     Mental Status: He is alert and oriented to person, place, and time.     Cranial Nerves: No cranial nerve deficit.     Motor: No abnormal muscle tone.     Coordination: Coordination normal.     Deep Tendon Reflexes: Reflexes are normal and symmetric. Reflexes normal.  Psychiatric:        Behavior: Behavior normal.        Thought Content: Thought content normal.        Judgment: Judgment normal.           Assessment & Plan:  Well exam. We discussed diet and exercise.  Get fasting labs. Set up his first colonoscopy. Treat the sinusitis with a Zpack. Gershon CraneStephen Cari Vandeberg, MD

## 2018-12-27 ENCOUNTER — Other Ambulatory Visit: Payer: Self-pay | Admitting: *Deleted

## 2018-12-27 ENCOUNTER — Encounter: Payer: Self-pay | Admitting: Family Medicine

## 2018-12-27 MED ORDER — POTASSIUM CHLORIDE ER 10 MEQ PO TBCR: 10.0000 meq | EXTENDED_RELEASE_TABLET | Freq: Every day | ORAL | 3 refills | Status: DC

## 2018-12-28 ENCOUNTER — Telehealth: Payer: Self-pay | Admitting: Family Medicine

## 2018-12-28 NOTE — Telephone Encounter (Signed)
The form is ready  

## 2018-12-28 NOTE — Telephone Encounter (Signed)
Form has been sent to the pharmacy.

## 2018-12-28 NOTE — Telephone Encounter (Signed)
Received a [facility]---they no longer cover the catapres 0.2 tabs  They requested for you to consider  Clonidine, doxazosin, prazosin or terazosin.  Dr. Clent Ridges please advise. Thanks  Form has been placed in the red folder for you to sign off on.

## 2019-07-14 ENCOUNTER — Other Ambulatory Visit: Payer: Self-pay | Admitting: Family Medicine

## 2019-12-10 ENCOUNTER — Other Ambulatory Visit: Payer: Self-pay | Admitting: Family Medicine

## 2019-12-11 ENCOUNTER — Other Ambulatory Visit: Payer: Self-pay | Admitting: Family Medicine

## 2019-12-21 ENCOUNTER — Other Ambulatory Visit: Payer: Self-pay | Admitting: Family Medicine

## 2019-12-25 ENCOUNTER — Other Ambulatory Visit: Payer: Self-pay

## 2019-12-25 ENCOUNTER — Telehealth (INDEPENDENT_AMBULATORY_CARE_PROVIDER_SITE_OTHER): Payer: 59 | Admitting: Family Medicine

## 2019-12-25 DIAGNOSIS — I10 Essential (primary) hypertension: Secondary | ICD-10-CM | POA: Diagnosis not present

## 2019-12-25 DIAGNOSIS — E785 Hyperlipidemia, unspecified: Secondary | ICD-10-CM | POA: Diagnosis not present

## 2019-12-25 DIAGNOSIS — R739 Hyperglycemia, unspecified: Secondary | ICD-10-CM

## 2019-12-25 DIAGNOSIS — Z0000 Encounter for general adult medical examination without abnormal findings: Secondary | ICD-10-CM

## 2019-12-25 MED ORDER — AMLODIPINE BESYLATE 10 MG PO TABS: ORAL_TABLET | ORAL | 3 refills | Status: DC

## 2019-12-25 MED ORDER — LISINOPRIL 40 MG PO TABS: ORAL_TABLET | ORAL | 3 refills | Status: DC

## 2019-12-25 MED ORDER — ATORVASTATIN CALCIUM 20 MG PO TABS: 20.0000 mg | ORAL_TABLET | Freq: Every day | ORAL | 3 refills | Status: DC

## 2019-12-25 MED ORDER — CLONIDINE HCL 0.2 MG PO TABS: 0.2000 mg | ORAL_TABLET | Freq: Two times a day (BID) | ORAL | 3 refills | Status: DC

## 2019-12-25 MED ORDER — POTASSIUM CHLORIDE ER 10 MEQ PO TBCR: 10.0000 meq | EXTENDED_RELEASE_TABLET | Freq: Every day | ORAL | 3 refills | Status: DC

## 2019-12-25 NOTE — Progress Notes (Signed)
Virtual Visit via Telephone Note  I connected with the patient on 12/25/19 at  3:30 PM EST by telephone and verified that I am speaking with the correct person using two identifiers.   I discussed the limitations, risks, security and privacy concerns of performing an evaluation and management service by telephone and the availability of in person appointments. I also discussed with the patient that there may be a patient responsible charge related to this service. The patient expressed understanding and agreed to proceed.  Location patient: home Location provider: work or home office Participants present for the call: patient, provider Patient did not have a visit in the prior 7 days to address this/these issue(s).   History of Present Illness: Here for medication refills. He had a well exam with labs with [place] one year ago, and he needs refills. He says his BP has been steady, but he doe snot check it very often. On two recent visits to specialists his BP was 152/92 and 137/88. He feels fine.    Observations/Objective: Patient sounds cheerful and well on the phone. I do not appreciate any SOB. Speech and thought processing are grossly intact. Patient reported vitals:  Assessment and Plan: HTN and dyslipidemia. We will refill all medications. We will set him up for fasting labs soon.  Gershon Crane, MD   Follow Up Instructions:     [phone] 5-10 [phone] 11-20 [phone] 21-30 I did not refer this patient for an OV in the next 24 hours for this/these issue(s).  I discussed the assessment and treatment plan with the patient. The patient was provided an opportunity to ask questions and all were answered. The patient agreed with the plan and demonstrated an understanding of the instructions.   The patient was advised to call back or seek an in-person evaluation if the symptoms worsen or if the condition fails to improve as anticipated.  I provided 11 minutes of non-face-to-face time during this  encounter.   Gershon Crane, MD

## 2020-01-08 ENCOUNTER — Other Ambulatory Visit: Payer: Self-pay

## 2020-01-09 ENCOUNTER — Telehealth: Payer: Self-pay | Admitting: Family Medicine

## 2020-01-09 ENCOUNTER — Other Ambulatory Visit (INDEPENDENT_AMBULATORY_CARE_PROVIDER_SITE_OTHER): Payer: 59

## 2020-01-09 DIAGNOSIS — R739 Hyperglycemia, unspecified: Secondary | ICD-10-CM

## 2020-01-09 DIAGNOSIS — Z0000 Encounter for general adult medical examination without abnormal findings: Secondary | ICD-10-CM | POA: Diagnosis not present

## 2020-01-09 LAB — LIPID PANEL
Cholesterol: 162 mg/dL (ref 0–200)
HDL: 47.5 mg/dL (ref >39.00)
LDL Cholesterol: 81 mg/dL (ref 0–99)
NonHDL: 114.19
Total CHOL/HDL Ratio: 3
Triglycerides: 165 mg/dL — ABNORMAL HIGH (ref 0.0–149.0)
VLDL: 33 mg/dL (ref 0.0–40.0)

## 2020-01-09 LAB — BASIC METABOLIC PANEL
BUN: 19 mg/dL (ref 6–23)
CO2: 33 mEq/L — ABNORMAL HIGH (ref 19–32)
Calcium: 9.9 mg/dL (ref 8.4–10.5)
Chloride: 101 mEq/L (ref 96–112)
Creatinine, Ser: 1.17 mg/dL (ref 0.40–1.50)
GFR: 78.47 mL/min (ref >60.00)
Glucose, Bld: 108 mg/dL — ABNORMAL HIGH (ref 70–99)
Potassium: 3.1 mEq/L — ABNORMAL LOW (ref 3.5–5.1)
Sodium: 142 mEq/L (ref 135–145)

## 2020-01-09 LAB — HEPATIC FUNCTION PANEL
ALT: 27 U/L (ref 0–53)
AST: 22 U/L (ref 0–37)
Albumin: 4.6 g/dL (ref 3.5–5.2)
Alkaline Phosphatase: 67 U/L (ref 39–117)
Bilirubin, Direct: 0.1 mg/dL (ref 0.0–0.3)
Total Bilirubin: 0.4 mg/dL (ref 0.2–1.2)
Total Protein: 7.5 g/dL (ref 6.0–8.3)

## 2020-01-09 LAB — CBC WITH DIFFERENTIAL/PLATELET
Basophils Absolute: 0 10*3/uL (ref 0.0–0.1)
Basophils Relative: 0.3 % (ref 0.0–3.0)
Eosinophils Absolute: 0.1 10*3/uL (ref 0.0–0.7)
Eosinophils Relative: 1.9 % (ref 0.0–5.0)
HCT: 38.6 % — ABNORMAL LOW (ref 39.0–52.0)
Hemoglobin: 13.1 g/dL (ref 13.0–17.0)
Lymphocytes Relative: 53.7 % — ABNORMAL HIGH (ref 12.0–46.0)
Lymphs Abs: 3.7 10*3/uL (ref 0.7–4.0)
MCHC: 33.8 g/dL (ref 30.0–36.0)
MCV: 88.3 fl (ref 78.0–100.0)
Monocytes Absolute: 0.4 10*3/uL (ref 0.1–1.0)
Monocytes Relative: 5.8 % (ref 3.0–12.0)
Neutro Abs: 2.6 10*3/uL (ref 1.4–7.7)
Neutrophils Relative %: 38.3 % — ABNORMAL LOW (ref 43.0–77.0)
Platelets: 265 10*3/uL (ref 150.0–400.0)
RBC: 4.37 Mil/uL (ref 4.22–5.81)
RDW: 13.5 % (ref 11.5–15.5)
WBC: 6.9 10*3/uL (ref 4.0–10.5)

## 2020-01-09 LAB — PSA: PSA: 0.67 ng/mL (ref 0.10–4.00)

## 2020-01-09 LAB — TSH: TSH: 3.96 u[IU]/mL (ref 0.35–4.50)

## 2020-01-09 LAB — HEMOGLOBIN A1C: Hgb A1c MFr Bld: 6.6 % — ABNORMAL HIGH (ref 4.6–6.5)

## 2020-01-09 NOTE — Telephone Encounter (Signed)
Stop the Clonidine and start on Losartan 50 mg daily, call in #30 with 2 rf

## 2020-01-09 NOTE — Telephone Encounter (Signed)
Patient states the Clonidine he is taking right now gives him headaches.  Sounds like it is a different manufacturer because it looks different he states.

## 2020-01-10 MED ORDER — LOSARTAN POTASSIUM 50 MG PO TABS: 50.0000 mg | ORAL_TABLET | Freq: Every day | ORAL | 2 refills | Status: DC

## 2020-01-10 NOTE — Telephone Encounter (Signed)
Spoke with the patient. He stated that he would like to stay on the clonidine.  He would like [facility] because they have the brand that he likes.  Please advise.

## 2020-01-11 MED ORDER — CLONIDINE HCL 0.2 MG PO TABS: 0.2000 mg | ORAL_TABLET | Freq: Two times a day (BID) | ORAL | 3 refills | Status: DC

## 2020-01-11 NOTE — Telephone Encounter (Signed)
Rx for Clonidine sent to [facility]. Left message on machine for patient informing him of the prescription.

## 2020-01-11 NOTE — Telephone Encounter (Signed)
Please stop the Losartan and change the Clonidine to the new pharmacy as he mentioned

## 2020-01-17 ENCOUNTER — Other Ambulatory Visit: Payer: Self-pay | Admitting: *Deleted

## 2020-01-17 MED ORDER — POTASSIUM CHLORIDE ER 10 MEQ PO TBCR: 10.0000 meq | EXTENDED_RELEASE_TABLET | Freq: Two times a day (BID) | ORAL | 3 refills | Status: DC

## 2020-02-02 ENCOUNTER — Ambulatory Visit: Payer: 59 | Attending: Internal Medicine

## 2020-02-02 DIAGNOSIS — Z23 Encounter for immunization: Secondary | ICD-10-CM

## 2020-02-02 NOTE — Progress Notes (Signed)
   [facility]  Name:  EKIN PILAR Sr.    MRN: [id number] DOB: 1965/11/17  02/02/2020  Mr. Tippett was observed post Covid-19 immunization for 15 minutes without incident. He was provided with Vaccine Information Sheet and instruction to access the V-Safe system.   Mr. Sebek was instructed to call 911 with any severe reactions post vaccine: Marland Kitchen Difficulty breathing  . Swelling of face and throat  . A fast heartbeat  . A bad rash all over body  . Dizziness and weakness   Immunizations Administered    Name Date Dose VIS Date Route   Pfizer COVID-19 Vaccine 02/02/2020  8:38 AM 0.3 mL 10/12/2019 Intramuscular   Manufacturer: [employer], [employer]   Lot: [id number]   NDC: [address]-7

## 2020-02-27 ENCOUNTER — Ambulatory Visit: Payer: 59 | Attending: Internal Medicine

## 2020-02-27 DIAGNOSIS — Z23 Encounter for immunization: Secondary | ICD-10-CM

## 2020-02-27 NOTE — Progress Notes (Signed)
   [facility]  Name:  TYRI ELMORE Sr.    MRN: [id number] DOB: 30-Jun-1966  02/27/2020  Mr. Eichinger was observed post Covid-19 immunization for 15 minutes without incident. He was provided with Vaccine Information Sheet and instruction to access the V-Safe system.   Mr. Macgowan was instructed to call 911 with any severe reactions post vaccine: Marland Kitchen Difficulty breathing  . Swelling of face and throat  . A fast heartbeat  . A bad rash all over body  . Dizziness and weakness   Immunizations Administered    Name Date Dose VIS Date Route   Pfizer COVID-19 Vaccine 02/27/2020  8:22 AM 0.3 mL 12/26/2018 Intramuscular   Manufacturer: [employer], [employer]   Lot: [id number]   NDC: [address]-4

## 2020-12-01 ENCOUNTER — Other Ambulatory Visit: Payer: Self-pay | Admitting: Family Medicine

## 2020-12-17 ENCOUNTER — Encounter: Payer: Self-pay | Admitting: Family Medicine

## 2020-12-17 ENCOUNTER — Telehealth (INDEPENDENT_AMBULATORY_CARE_PROVIDER_SITE_OTHER): Payer: No Typology Code available for payment source | Admitting: Family Medicine

## 2020-12-17 VITALS — Ht 75.0 in | Wt 245.0 lb

## 2020-12-17 DIAGNOSIS — E785 Hyperlipidemia, unspecified: Secondary | ICD-10-CM | POA: Diagnosis not present

## 2020-12-17 DIAGNOSIS — I10 Essential (primary) hypertension: Secondary | ICD-10-CM

## 2020-12-17 MED ORDER — AMLODIPINE BESYLATE 10 MG PO TABS: ORAL_TABLET | ORAL | 3 refills | Status: DC

## 2020-12-17 MED ORDER — ATORVASTATIN CALCIUM 20 MG PO TABS: 20.0000 mg | ORAL_TABLET | Freq: Every day | ORAL | 3 refills | Status: DC

## 2020-12-17 MED ORDER — LISINOPRIL 40 MG PO TABS: ORAL_TABLET | ORAL | 3 refills | Status: DC

## 2020-12-17 MED ORDER — CLONIDINE HCL 0.2 MG PO TABS: ORAL_TABLET | ORAL | 3 refills | Status: DC

## 2020-12-17 NOTE — Progress Notes (Signed)
   Subjective:    Patient ID: Phillip Fennel., male    DOB: 1966-09-10, 55 y.o.   MRN: [id number]  HPI Virtual Visit via Telephone Note  I connected with the patient on 12/17/20 at  8:45 AM EST by telephone and verified that I am speaking with the correct person using two identifiers.   I discussed the limitations, risks, security and privacy concerns of performing an evaluation and management service by telephone and the availability of in person appointments. I also discussed with the patient that there may be a patient responsible charge related to this service. The patient expressed understanding and agreed to proceed.  Location patient: home Location provider: work or home office Participants present for the call: patient, provider Patient did not have a visit in the prior 7 days to address this/these issue(s).   History of Present Illness: Here to follow up on HTN and dyslipidemia. He feels fine. His BP at home 2 weeks ago was 135/90. He and his wife are trying to eat a healthier diet.    Observations/Objective: Patient sounds cheerful and well on the phone. I do not appreciate any SOB. Speech and thought processing are grossly intact. Patient reported vitals:  Assessment and Plan: HTN and dyslipidemia. We will refill his medications. He will setup a lab appt soon to check lipids, etc.  Gershon Crane, MD   Follow Up Instructions:     [phone] 5-10 [phone] 11-20 [phone] 21-30 I did not refer this patient for an OV in the next 24 hours for this/these issue(s).  I discussed the assessment and treatment plan with the patient. The patient was provided an opportunity to ask questions and all were answered. The patient agreed with the plan and demonstrated an understanding of the instructions.   The patient was advised to call back or seek an in-person evaluation if the symptoms worsen or if the condition fails to improve as anticipated.  I provided 10 minutes of  non-face-to-face time during this encounter.   Gershon Crane, MD    Review of Systems     Objective:   Physical Exam        Assessment & Plan:

## 2020-12-28 ENCOUNTER — Other Ambulatory Visit: Payer: Self-pay | Admitting: Family Medicine

## 2022-01-20 ENCOUNTER — Telehealth: Payer: Self-pay | Admitting: Family Medicine

## 2022-01-20 NOTE — Telephone Encounter (Signed)
Pt has mychart appt tomorrow and wife is calling to report for last 3 or 4 months pt has been having wt loss and looks like he is 9 months pregnant. ?

## 2022-01-20 NOTE — Telephone Encounter (Signed)
Patient was last seen in office 12/14/2018 ?

## 2022-01-21 ENCOUNTER — Encounter: Payer: Self-pay | Admitting: Family Medicine

## 2022-01-21 ENCOUNTER — Telehealth (INDEPENDENT_AMBULATORY_CARE_PROVIDER_SITE_OTHER): Payer: No Typology Code available for payment source | Admitting: Family Medicine

## 2022-01-21 DIAGNOSIS — E785 Hyperlipidemia, unspecified: Secondary | ICD-10-CM

## 2022-01-21 DIAGNOSIS — I10 Essential (primary) hypertension: Secondary | ICD-10-CM

## 2022-01-21 MED ORDER — LISINOPRIL 40 MG PO TABS: ORAL_TABLET | ORAL | 0 refills | Status: DC

## 2022-01-21 MED ORDER — CLONIDINE HCL 0.2 MG PO TABS: ORAL_TABLET | ORAL | 0 refills | Status: DC

## 2022-01-21 MED ORDER — ATORVASTATIN CALCIUM 20 MG PO TABS
20.0000 mg | ORAL_TABLET | Freq: Every day | ORAL | 0 refills | Status: DC
Start: 2022-01-21 — End: 2022-01-29

## 2022-01-21 MED ORDER — POTASSIUM CHLORIDE ER 10 MEQ PO TBCR: 10.0000 meq | EXTENDED_RELEASE_TABLET | Freq: Two times a day (BID) | ORAL | 0 refills | Status: DC

## 2022-01-21 MED ORDER — AMLODIPINE BESYLATE 10 MG PO TABS: ORAL_TABLET | ORAL | 0 refills | Status: DC

## 2022-01-21 NOTE — Telephone Encounter (Signed)
I did get this message. We have been trying to contact the patient to ask him to come into the office for an exam, but we have been unable to reach him.  ?

## 2022-01-21 NOTE — Progress Notes (Addendum)
? ?Subjective:  ? ? Patient ID: [name]., male    DOB: 07-Dec-1965, 56 y.o.   MRN: [id number] ? ?HPI ?Virtual Visit via Video Note ? ?I connected with the patient on 01/21/22 at  8:30 AM EDT by a video enabled telemedicine application and verified that I am speaking with the correct person using two identifiers. ? Location patient: home ?Location provider:work or home office ?Persons participating in the virtual visit: patient, provider ? ?I discussed the limitations of evaluation and management by telemedicine and the availability of in person appointments. The patient expressed understanding and agreed to proceed. ? ? ?HPI: ?Here for medication refills. He says he feels fine. His BP at home averages in the 130s over 80s.  ? ? ?ROS: See pertinent positives and negatives per HPI. ? ?Past Medical History:  ?Diagnosis Date  ? Arthritis   ? Difficulty sleeping   ? due to pain  ? Frequency of urination   ? Headache(784.0)   ? hx migraines  ? Heart murmur   ? as a child   ? Hypertension   ? Tendon tear   ? rt leg  ? ? ?Past Surgical History:  ?Procedure Laterality Date  ? HERNIA REPAIR    ? umbilical, as a newborn   ? KNEE ARTHROSCOPY    ? bilateral  ? QUADRICEPS TENDON REPAIR Right 08/05/2014  ? Procedure: REVISION RIGHT  QUAD TENDON REPAIR, SCAR DEBRIDEMENT EXCISION, OPEN MEDIAL AND LATERAL RETINACULAR RELEASE;  Surgeon: Shelda Pal, MD;  Location: [facility];  Service: Orthopedics;  Laterality: Right;  ? QUADRICEPS TENDON REPAIR Right 08/11/2015  ? Procedure: OPEN REPAIR QUADRICEP TENDON REPAIR REVISION;  Surgeon: Durene Romans, MD;  Location: [facility];  Service: Orthopedics;  Laterality: Right;  ? ? ?Family History  ?Problem Relation Age of Onset  ? Kidney disease Mother   ? Sarcoidosis Mother   ? Diabetes Father   ? Heart disease Father   ? Hyperlipidemia Father   ? Hypertension Father   ? ? ? ?Current Outpatient Medications:  ?  aspirin 81 MG chewable tablet, Chew daily by mouth., Disp: , Rfl:  ?   aspirin-acetaminophen-caffeine (EXCEDRIN MIGRAINE) 250-250-65 MG tablet, Take 2 tablets every 6 (six) hours as needed by mouth for headache., Disp: , Rfl:  ?  polyethylene glycol (MIRALAX / GLYCOLAX) packet, Take 17 g by mouth 2 (two) times daily., Disp: 14 each, Rfl: 0 ?  amLODipine (NORVASC) 10 MG tablet, TAKE 1 TABLET BY MOUTH EVERY DAY IN THE EVENING, Disp: 30 tablet, Rfl: 0 ?  atorvastatin (LIPITOR) 20 MG tablet, Take 1 tablet (20 mg total) by mouth daily., Disp: 30 tablet, Rfl: 0 ?  cloNIDine (CATAPRES) 0.2 MG tablet, TAKE 1 TABLET BY MOUTH 2 (TWO) TIMES DAILY., Disp: 60 tablet, Rfl: 0 ?  lisinopril (ZESTRIL) 40 MG tablet, TAKE 1 TABLET BY MOUTH EVERY DAY IN THE EVENING, Disp: 30 tablet, Rfl: 0 ?  potassium chloride (KLOR-CON 10) 10 MEQ tablet, Take 1 tablet (10 mEq total) by mouth 2 (two) times daily., Disp: 60 tablet, Rfl: 0 ? ?EXAM: ? ?VITALS per patient if applicable: ? ?GENERAL: alert, oriented, appears well and in no acute distress ? ?HEENT: atraumatic, conjunttiva clear, no obvious abnormalities on inspection of external nose and ears ? ?NECK: normal movements of the head and neck ? ?LUNGS: on inspection no signs of respiratory distress, breathing rate appears normal, no obvious gross SOB, gasping or wheezing ? ?CV: no obvious cyanosis ? ?MS: moves all visible  extremities without noticeable abnormality ? ?PSYCH/NEURO: pleasant and cooperative, no obvious depression or anxiety, speech and thought processing grossly intact ? ?ASSESSMENT AND PLAN: ?His HTN seems to be stable. For the dyslipidemia, he needs to be checked again.  ?I reminded him that we have not seen him here in the office for an exam in over 3 years. I did refill the medications for 30 days, but I told him we cannot refill anything after that unless he comes in to see [place]. He agreed to schedule a well exam with labs sometime in the next few weeks.  ?Gershon Crane, MD ? ?Discussed the following assessment and plan: ? ?No diagnosis found. ? ? ?   ?I discussed the assessment and treatment plan with the patient. The patient was provided an opportunity to ask questions and all were answered. The patient agreed with the plan and demonstrated an understanding of the instructions. ?  ?The patient was advised to call back or seek an in-person evaluation if the symptoms worsen or if the condition fails to improve as anticipated. ? ?  ? ? ?Review of Systems ? ?   ?Objective:  ? Physical Exam ? ? ? ? ?   ?Assessment & Plan:  ? ? ?

## 2022-01-29 ENCOUNTER — Ambulatory Visit (INDEPENDENT_AMBULATORY_CARE_PROVIDER_SITE_OTHER): Payer: No Typology Code available for payment source | Admitting: Family Medicine

## 2022-01-29 ENCOUNTER — Encounter: Payer: Self-pay | Admitting: Family Medicine

## 2022-01-29 VITALS — BP 142/102 | HR 74 | Temp 98.6°F | Wt 241.0 lb

## 2022-01-29 DIAGNOSIS — Z0000 Encounter for general adult medical examination without abnormal findings: Secondary | ICD-10-CM

## 2022-01-29 LAB — CBC WITH DIFFERENTIAL/PLATELET
Basophils Absolute: 0 10*3/uL (ref 0.0–0.1)
Basophils Relative: 0.5 % (ref 0.0–3.0)
Eosinophils Absolute: 0.2 10*3/uL (ref 0.0–0.7)
Eosinophils Relative: 2.8 % (ref 0.0–5.0)
HCT: 39.9 % (ref 39.0–52.0)
Hemoglobin: 13.4 g/dL (ref 13.0–17.0)
Lymphocytes Relative: 49.3 % — ABNORMAL HIGH (ref 12.0–46.0)
Lymphs Abs: 3 10*3/uL (ref 0.7–4.0)
MCHC: 33.5 g/dL (ref 30.0–36.0)
MCV: 87.2 fl (ref 78.0–100.0)
Monocytes Absolute: 0.4 10*3/uL (ref 0.1–1.0)
Monocytes Relative: 6.5 % (ref 3.0–12.0)
Neutro Abs: 2.5 10*3/uL (ref 1.4–7.7)
Neutrophils Relative %: 40.9 % — ABNORMAL LOW (ref 43.0–77.0)
Platelets: 258 10*3/uL (ref 150.0–400.0)
RBC: 4.58 Mil/uL (ref 4.22–5.81)
RDW: 14.5 % (ref 11.5–15.5)
WBC: 6.1 10*3/uL (ref 4.0–10.5)

## 2022-01-29 LAB — BASIC METABOLIC PANEL
BUN: 15 mg/dL (ref 6–23)
CO2: 32 mEq/L (ref 19–32)
Calcium: 10.3 mg/dL (ref 8.4–10.5)
Chloride: 101 mEq/L (ref 96–112)
Creatinine, Ser: 1.21 mg/dL (ref 0.40–1.50)
GFR: 66.93 mL/min (ref >60.00)
Glucose, Bld: 102 mg/dL — ABNORMAL HIGH (ref 70–99)
Potassium: 3 mEq/L — ABNORMAL LOW (ref 3.5–5.1)
Sodium: 144 mEq/L (ref 135–145)

## 2022-01-29 LAB — LIPID PANEL
Cholesterol: 157 mg/dL (ref 0–200)
HDL: 62.6 mg/dL (ref >39.00)
LDL Cholesterol: 76 mg/dL (ref 0–99)
NonHDL: 94.87
Total CHOL/HDL Ratio: 3
Triglycerides: 93 mg/dL (ref 0.0–149.0)
VLDL: 18.6 mg/dL (ref 0.0–40.0)

## 2022-01-29 LAB — HEPATIC FUNCTION PANEL
ALT: 24 U/L (ref 0–53)
AST: 21 U/L (ref 0–37)
Albumin: 5 g/dL (ref 3.5–5.2)
Alkaline Phosphatase: 50 U/L (ref 39–117)
Bilirubin, Direct: 0.1 mg/dL (ref 0.0–0.3)
Total Bilirubin: 0.5 mg/dL (ref 0.2–1.2)
Total Protein: 7.6 g/dL (ref 6.0–8.3)

## 2022-01-29 LAB — TSH: TSH: 4.69 u[IU]/mL (ref 0.35–5.50)

## 2022-01-29 LAB — PSA: PSA: 2.35 ng/mL (ref 0.10–4.00)

## 2022-01-29 LAB — HEMOGLOBIN A1C: Hgb A1c MFr Bld: 6.6 % — ABNORMAL HIGH (ref 4.6–6.5)

## 2022-01-29 MED ORDER — ATORVASTATIN CALCIUM 20 MG PO TABS: 20.0000 mg | ORAL_TABLET | Freq: Every day | ORAL | 3 refills | Status: DC

## 2022-01-29 MED ORDER — LISINOPRIL 40 MG PO TABS: ORAL_TABLET | ORAL | 3 refills | Status: DC

## 2022-01-29 MED ORDER — AMLODIPINE BESYLATE 10 MG PO TABS: ORAL_TABLET | ORAL | 3 refills | Status: DC

## 2022-01-29 MED ORDER — CLONIDINE HCL 0.2 MG PO TABS
ORAL_TABLET | ORAL | 3 refills | Status: DC
Start: 2022-01-29 — End: 2023-02-22

## 2022-01-29 NOTE — Progress Notes (Signed)
? ?Subjective:  ? ? Patient ID: [name]., male    DOB: October 03, 1966, 56 y.o.   MRN: [id number] ? ?HPI ?Here for a well exam. He feels fine except for chronic pain in the right leg from his reconstructive surgery. His BP is stable at home with average readings in the 130s over 80s. He has discovered that one generic form of Clonidine which has additives to give the pills a pink color cause him to have migraine headaches. He can tolerate the other form, which is white in color, just fine.  ? ? ?Review of Systems  ?Constitutional: Negative.   ?HENT: Negative.    ?Eyes: Negative.   ?Respiratory: Negative.    ?Cardiovascular: Negative.   ?Gastrointestinal: Negative.   ?Genitourinary: Negative.   ?Musculoskeletal:  Positive for arthralgias.  ?Skin: Negative.   ?Neurological: Negative.   ?Psychiatric/Behavioral: Negative.    ? ?   ?Objective:  ? Physical Exam ?Constitutional:   ?   General: He is not in acute distress. ?   Appearance: Normal appearance. He is well-developed. He is not diaphoretic.  ?HENT:  ?   Head: Normocephalic and atraumatic.  ?   Right Ear: External ear normal.  ?   Left Ear: External ear normal.  ?   Nose: Nose normal.  ?   Mouth/Throat:  ?   Pharynx: No oropharyngeal exudate.  ?Eyes:  ?   General: No scleral icterus.    ?   Right eye: No discharge.     ?   Left eye: No discharge.  ?   Conjunctiva/sclera: Conjunctivae normal.  ?   Pupils: Pupils are equal, round, and reactive to light.  ?Neck:  ?   Thyroid: No thyromegaly.  ?   Vascular: No JVD.  ?   Trachea: No tracheal deviation.  ?Cardiovascular:  ?   Rate and Rhythm: Normal rate and regular rhythm.  ?   Heart sounds: Normal heart sounds. No murmur heard. ?  No friction rub. No gallop.  ?Pulmonary:  ?   Effort: Pulmonary effort is normal. No respiratory distress.  ?   Breath sounds: Normal breath sounds. No wheezing or rales.  ?Chest:  ?   Chest wall: No tenderness.  ?Abdominal:  ?   General: Bowel sounds are normal. There is no  distension.  ?   Palpations: Abdomen is soft. There is no mass.  ?   Tenderness: There is no abdominal tenderness. There is no guarding or rebound.  ?Genitourinary: ?   Penis: No tenderness.   ?Musculoskeletal:     ?   General: No tenderness. Normal range of motion.  ?   Cervical back: Neck supple.  ?Lymphadenopathy:  ?   Cervical: No cervical adenopathy.  ?Skin: ?   General: Skin is warm and dry.  ?   Coloration: Skin is not pale.  ?   Findings: No erythema or rash.  ?Neurological:  ?   Mental Status: He is alert and oriented to person, place, and time.  ?   Cranial Nerves: No cranial nerve deficit.  ?   Motor: No abnormal muscle tone.  ?   Coordination: Coordination normal.  ?   Deep Tendon Reflexes: Reflexes are normal and symmetric. Reflexes normal.  ?Psychiatric:     ?   Behavior: Behavior normal.     ?   Thought Content: Thought content normal.     ?   Judgment: Judgment normal.  ? ? ? ? ? ?   ?Assessment &  Plan:  ?Well exam. We discussed diet and exercise. Get fasting labs. Set up a colonoscopy.  ?Alysia Penna, MD ? ? ?

## 2022-02-03 ENCOUNTER — Other Ambulatory Visit: Payer: Self-pay

## 2022-02-03 ENCOUNTER — Telehealth: Payer: Self-pay

## 2022-02-03 DIAGNOSIS — E876 Hypokalemia: Secondary | ICD-10-CM

## 2022-02-03 MED ORDER — POTASSIUM CHLORIDE CRYS ER 10 MEQ PO TBCR: 10.0000 meq | EXTENDED_RELEASE_TABLET | Freq: Every day | ORAL | 3 refills | Status: DC

## 2022-02-03 NOTE — Telephone Encounter (Signed)
Called patient to discuss labs on 02/02/2022.  When asked patient to verify DOB, pt refused stating that he doesn't give that information out over the phone, and will come to office to receive results in person.    Sent prescription for Klor[facility]Con to [facility] on [address].  ?

## 2022-02-03 NOTE — Telephone Encounter (Signed)
Pt came in the office this morning to pick up a copy of his Lab results, reviewed results with pt and gave a copy, pt verbalized understanding ?

## 2022-02-09 ENCOUNTER — Encounter: Payer: Self-pay | Admitting: Gastroenterology

## 2022-03-15 ENCOUNTER — Ambulatory Visit (AMBULATORY_SURGERY_CENTER): Payer: No Typology Code available for payment source | Admitting: *Deleted

## 2022-03-15 VITALS — Ht 75.0 in | Wt 247.0 lb

## 2022-03-15 DIAGNOSIS — Z1211 Encounter for screening for malignant neoplasm of colon: Secondary | ICD-10-CM

## 2022-03-15 MED ORDER — NA SULFATE-K SULFATE-MG SULF 17.5-3.13-1.6 GM/177ML PO SOLN: 1.0000 | Freq: Once | ORAL | 0 refills | Status: AC

## 2022-03-15 NOTE — Progress Notes (Signed)
No egg or soy allergy known to patient  ?No issues known to pt with past sedation with any surgeries or procedures ?Patient denies ever being told they had issues or difficulty with intubation  ?No FH of Malignant Hyperthermia ?Pt is not on diet pills ?Pt is not on  home 02  ?Pt is not on blood thinners  ?Pt denies issues with constipation  ?No A fib or A flutter ? ?suprep Coupon to pt in PV today , Code to Pharmacy and  NO PA's for preps discussed with pt In PV today  ?Discussed with pt there will be an out-of-pocket cost for prep and that varies from $0 to 70 +  dollars - pt verbalized understanding  ?Pt instructed to use Single[employer].com or GoodRx for a price reduction on prep  ? ?PV completed over the phone. Pt verified name, DOB, address and insurance during PV today.  ?Pt mailed instruction packet with copy of consent form to read and not return, and instructions.  ?Pt encouraged to call with questions or issues.  ?If pt has My chart, procedure instructions sent via My Chart  ?Insurance confirmed with pt at [facility] today   ? ?Denies constipation,was using miralx d/t   medication. ?

## 2022-03-30 ENCOUNTER — Telehealth: Payer: Self-pay | Admitting: Gastroenterology

## 2022-03-30 NOTE — Telephone Encounter (Signed)
Patient called to cancel appointment for procedure on 04/05/22. Patient states he will no longer be going through with the procedure.  Thank You.

## 2022-04-05 ENCOUNTER — Encounter: Payer: No Typology Code available for payment source | Admitting: Gastroenterology

## 2022-11-05 ENCOUNTER — Telehealth: Payer: Self-pay

## 2022-11-05 NOTE — Telephone Encounter (Signed)
Transition Care Management Follow-up Telephone Call Date of discharge and from where: [facility] 11/04/2022 How have you been since you were released from the hospital? weak Any questions or concerns? No  Items Reviewed: Did the pt receive and understand the discharge instructions provided? Yes  Medications obtained and verified? Yes  Other? No  Any new allergies since your discharge? No  Dietary orders reviewed? Yes Do you have support at home? Yes   Home Care and Equipment/Supplies: Were home health services ordered? yes If so, what is the name of the agency? unknown  Has the agency set up a time to come to the patient's home? no Were any new equipment or medical supplies ordered?  Yes: ostomy What is the name of the medical supply agency? unknown Were you able to get the supplies/equipment? no Do you have any questions related to the use of the equipment or supplies? No  Functional Questionnaire: (I = Independent and D = Dependent) ADLs: I  Bathing/Dressing- D  Meal Prep- I  Eating- I  Maintaining continence- D  Transferring/Ambulation- I  Managing Meds- I  Follow up appointments reviewed:  PCP Hospital f/u appt confirmed? No  Patient declined [facility] f/u appt confirmed? No  Patient will schedule Are transportation arrangements needed? No  If their condition worsens, is the pt aware to call PCP or go to the Emergency Dept.? Yes Was the patient provided with contact information for the PCP's office or ED? Yes Was to pt encouraged to call back with questions or concerns? Yes Juanda Crumble, LPN [place] Direct Dial [phone]

## 2023-01-31 HISTORY — PX: ILEOSTOMY: SHX1783

## 2023-01-31 HISTORY — PX: SIGMOIDECTOMY: SHX176

## 2023-01-31 HISTORY — PX: ILEOSTOMY REVISION: SHX1785

## 2023-02-22 ENCOUNTER — Other Ambulatory Visit: Payer: Self-pay | Admitting: Family Medicine

## 2023-02-22 ENCOUNTER — Telehealth: Payer: Self-pay

## 2023-02-22 NOTE — Transitions of Care (Post Inpatient/ED Visit) (Signed)
   02/22/2023  Name: Phillip Na Sr. MRN: [id number] DOB: [birth year]  Today's TOC FU Call Status: Today's TOC FU Call Status:: Unsuccessul Call (1st Attempt) Unsuccessful Call (1st Attempt) Date: 02/22/23  Attempted to reach the patient regarding the most recent Inpatient/ED visit.  Follow Up Plan: Additional outreach attempts will be made to reach the patient to complete the Transitions of Care (Post Inpatient/ED visit) call.   Signature  Kandis Fantasia, LPN [facility] Health Advisor [facility] You Are. We Are. [employer] # [phone]

## 2023-05-18 ENCOUNTER — Other Ambulatory Visit: Payer: Self-pay | Admitting: Family Medicine

## 2023-05-18 ENCOUNTER — Ambulatory Visit: Payer: No Typology Code available for payment source | Admitting: Family Medicine

## 2023-05-18 ENCOUNTER — Encounter: Payer: Self-pay | Admitting: Family Medicine

## 2023-05-18 VITALS — BP 140/96 | HR 72 | Temp 98.4°F | Wt 237.0 lb

## 2023-05-18 DIAGNOSIS — I10 Essential (primary) hypertension: Secondary | ICD-10-CM | POA: Diagnosis not present

## 2023-05-18 DIAGNOSIS — D649 Anemia, unspecified: Secondary | ICD-10-CM

## 2023-05-18 DIAGNOSIS — K562 Volvulus: Secondary | ICD-10-CM

## 2023-05-18 LAB — BASIC METABOLIC PANEL
BUN: 19 mg/dL (ref 6–23)
CO2: 31 mEq/L (ref 19–32)
Calcium: 9.9 mg/dL (ref 8.4–10.5)
Chloride: 99 mEq/L (ref 96–112)
Creatinine, Ser: 1.23 mg/dL (ref 0.40–1.50)
GFR: 65.03 mL/min (ref >60.00)
Glucose, Bld: 101 mg/dL — ABNORMAL HIGH (ref 70–99)
Potassium: 3.4 mEq/L — ABNORMAL LOW (ref 3.5–5.1)
Sodium: 141 mEq/L (ref 135–145)

## 2023-05-18 LAB — IBC + FERRITIN
Ferritin: 32.4 ng/mL (ref 22.0–322.0)
Iron: 44 ug/dL (ref 42–165)
Saturation Ratios: 10.3 % — ABNORMAL LOW (ref 20.0–50.0)
TIBC: 425.6 ug/dL (ref 250.0–450.0)
Transferrin: 304 mg/dL (ref 212.0–360.0)

## 2023-05-18 LAB — CBC WITH DIFFERENTIAL/PLATELET
Basophils Absolute: 0 10*3/uL (ref 0.0–0.1)
Basophils Relative: 0.3 % (ref 0.0–3.0)
Eosinophils Absolute: 0.1 10*3/uL (ref 0.0–0.7)
Eosinophils Relative: 1.3 % (ref 0.0–5.0)
HCT: 37.3 % — ABNORMAL LOW (ref 39.0–52.0)
Hemoglobin: 12.1 g/dL — ABNORMAL LOW (ref 13.0–17.0)
Lymphocytes Relative: 51.8 % — ABNORMAL HIGH (ref 12.0–46.0)
Lymphs Abs: 3 10*3/uL (ref 0.7–4.0)
MCHC: 32.5 g/dL (ref 30.0–36.0)
MCV: 82.9 fl (ref 78.0–100.0)
Monocytes Absolute: 0.4 10*3/uL (ref 0.1–1.0)
Monocytes Relative: 6.4 % (ref 3.0–12.0)
Neutro Abs: 2.3 10*3/uL (ref 1.4–7.7)
Neutrophils Relative %: 40.2 % — ABNORMAL LOW (ref 43.0–77.0)
Platelets: 318 10*3/uL (ref 150.0–400.0)
RBC: 4.49 Mil/uL (ref 4.22–5.81)
RDW: 15.7 % — ABNORMAL HIGH (ref 11.5–15.5)
WBC: 5.8 10*3/uL (ref 4.0–10.5)

## 2023-05-18 LAB — VITAMIN B12: Vitamin B-12: 484 pg/mL (ref 211–911)

## 2023-05-18 MED ORDER — AMLODIPINE BESYLATE 10 MG PO TABS: ORAL_TABLET | ORAL | 3 refills | Status: DC

## 2023-05-18 MED ORDER — CLONIDINE HCL 0.2 MG PO TABS: 0.2000 mg | ORAL_TABLET | Freq: Every day | ORAL | 3 refills | Status: DC

## 2023-05-18 MED ORDER — LISINOPRIL 40 MG PO TABS: ORAL_TABLET | ORAL | 3 refills | Status: DC

## 2023-05-18 NOTE — Progress Notes (Signed)
 u

## 2023-05-18 NOTE — Telephone Encounter (Signed)
He is taking this only ONCE a day

## 2023-05-18 NOTE — Progress Notes (Signed)
   Subjective:    Patient ID: [name]., male    DOB: 1966/10/03, 57 y.o.   MRN: [id number]  HPI Her for med refills and to follow up from a stay at [facility] from 02-11-23 to 02-18-23 for abdominal pains. 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 He then had a second surgery to resect the sigmoid colon and take down the ileostomy. The second surgery went well, and he has been doing fine since then. He has no abdominal pain or nausea, and his bowels move normally. In the hospital he became profoundly anemic with a Hgb of 4.4 (this was 8.4 on admission). After transfusions this was up to 6.6 at DC. Folic acid was normal, but W09 was low at 179, and iron and ferritin were low at 11 and 46. Since going home his BP at home has been stable with systolic readings in the 130's and 120's.    Review of Systems  Constitutional: Negative.   Respiratory: Negative.    Cardiovascular: Negative.   Gastrointestinal: Negative.   Genitourinary: Negative.        Objective:   Physical Exam Constitutional:      Appearance: Normal appearance.  Cardiovascular:     Rate and Rhythm: Normal rate and regular rhythm.     Pulses: Normal pulses.     Heart sounds: Normal heart sounds.  Pulmonary:     Effort: Pulmonary effort is normal.     Breath sounds: Normal breath sounds.  Neurological:     Mental Status: He is alert.           Assessment & Plan:  He is recovering from two abdominal surgeries as above. His HTN is stable, so we will refill his medications. He is quite anemic however. He we prescribed iron supplements and b12 shots, but he declined taking these. Instead he is taking some OTC supplements. We agreed to get labs today to recheck CBC, iron, ferritin, and B12. We spent a total of (35   ) minutes reviewing records and discussing these issues.   Gershon Crane, MD

## 2023-05-18 NOTE — Telephone Encounter (Signed)
Spoke with Zollie Scale and informed her of the instructions as below.

## 2023-06-15 ENCOUNTER — Telehealth: Payer: Self-pay | Admitting: Family Medicine

## 2023-06-15 NOTE — Telephone Encounter (Signed)
Pt came in person to get a physical copy of his lab results. I asked pt if he had spoken to the nurse to go over the results. He stated he had not. I saw that several attempts had been made by the nurse to go over his results and I let him know they have been trying to contact him. I let him know I am unable to print out a physical copy of his lab results until he returns the nurse's call to go over his results. Pt stated to not worry about it and left the office.

## 2023-08-15 ENCOUNTER — Other Ambulatory Visit: Payer: Self-pay | Admitting: Family Medicine

## 2023-11-03 ENCOUNTER — Telehealth: Payer: Self-pay | Admitting: *Deleted

## 2023-11-03 NOTE — Telephone Encounter (Signed)
 Copied from CRM [id number]. Topic: General - Other >> Nov 01, 2023 12:18 PM Tiffany H wrote: Reason for CRM: [employer] RN Case Manager Ronal BRAVO called to verify if patient is currently an active patient. Advised that patient has Dr. Johnny listed as PCP. Patient's last visit was March of 2023, did not disclose this. Ronal advised that she is taking over as patient's care production designer, theatre/television/film and would like to know if there's anything she can do to help facilitate patient care.  Phone: [phone] Hours: 8-4:30PM [employer]

## 2023-11-04 ENCOUNTER — Telehealth: Payer: Self-pay | Admitting: *Deleted

## 2023-11-04 NOTE — Telephone Encounter (Signed)
 Copied from CRM [id number]. Topic: General - Other >> Nov 01, 2023 12:18 PM Tiffany H wrote: Reason for CRM: [employer] RN Case Manager Ronal BRAVO called to verify if patient is currently an active patient. Advised that patient has Dr. Johnny listed as PCP. Patient's last visit was March of 2023, did not disclose this. Ronal advised that she is taking over as patient's care production designer, theatre/television/film and would like to know if there's anything she can do to help facilitate patient care.  Phone: [phone] Hours: 8-4:30PM [employer]

## 2023-11-07 NOTE — Telephone Encounter (Signed)
 Left detailed message for [facility] with Aundria Rud advised that pt is under Dr Clent Ridges care. Advised to call the office with any questions

## 2023-11-07 NOTE — Telephone Encounter (Signed)
 This encounter is a duplicate. Already completed

## 2023-12-16 ENCOUNTER — Other Ambulatory Visit: Payer: Self-pay | Admitting: Family Medicine

## 2024-05-05 ENCOUNTER — Other Ambulatory Visit: Payer: Self-pay | Admitting: Family Medicine

## 2024-05-09 ENCOUNTER — Other Ambulatory Visit: Payer: Self-pay | Admitting: Family Medicine

## 2024-08-05 ENCOUNTER — Other Ambulatory Visit: Payer: Self-pay | Admitting: Family Medicine

## 2024-11-01 ENCOUNTER — Other Ambulatory Visit: Payer: Self-pay | Admitting: Family Medicine

## 2024-12-05 ENCOUNTER — Encounter: Payer: Self-pay | Admitting: Family Medicine

## 2024-12-05 ENCOUNTER — Ambulatory Visit: Admitting: Family Medicine

## 2024-12-05 VITALS — BP 148/96 | HR 74 | Temp 98.7°F | Wt 264.0 lb

## 2024-12-05 DIAGNOSIS — I10 Essential (primary) hypertension: Secondary | ICD-10-CM

## 2024-12-05 NOTE — Progress Notes (Signed)
" ° °  Subjective:    Patient ID: Phillip LITTIE Delores Chrystal., male    DOB: 1966-06-10, 59 y.o.   MRN: [id number]  HPI Here to follow up on HTN. He feels well in general. He takes his medications regularly and he watches his diet closely. He avoids sodium whenever possible. He is trying to lose weight. Most of the time his BP runs 140's over 90's.    Review of Systems  Constitutional: Negative.   Respiratory: Negative.    Cardiovascular: Negative.   Neurological: Negative.        Objective:   Physical Exam Constitutional:      Appearance: He is obese.  Cardiovascular:     Rate and Rhythm: Normal rate and regular rhythm.     Pulses: Normal pulses.     Heart sounds: Normal heart sounds.  Pulmonary:     Effort: Pulmonary effort is normal.     Breath sounds: Normal breath sounds.  Musculoskeletal:     Right lower leg: No edema.     Left lower leg: No edema.  Neurological:     Mental Status: He is alert.           Assessment & Plan:  His HTN is under marginal control. I suggested we add hydrochlorothiazide to his regimen, but he declined saying he would rather keep his medications as are. He wants to try losing weight on his own.  Garnette Olmsted, MD   "
